# Patient Record
Sex: Female | Born: 1970 | Race: White | Hispanic: No | Marital: Married | Smoking: Never smoker
Health system: Southern US, Community
[De-identification: ages and names within clinical notes are randomized; demographics above are authoritative.]

## PROBLEM LIST (undated history)

## (undated) DIAGNOSIS — K219 Gastro-esophageal reflux disease without esophagitis: Secondary | ICD-10-CM

## (undated) DIAGNOSIS — N920 Excessive and frequent menstruation with regular cycle: Secondary | ICD-10-CM

## (undated) HISTORY — DX: Excessive and frequent menstruation with regular cycle: N92.0

## (undated) HISTORY — PX: WISDOM TOOTH EXTRACTION: SHX21

## (undated) HISTORY — DX: Gastro-esophageal reflux disease without esophagitis: K21.9

---

## 1998-07-11 ENCOUNTER — Other Ambulatory Visit: Admission: RE | Admit: 1998-07-11 | Discharge: 1998-07-11 | Payer: Self-pay | Admitting: Gynecology

## 1998-08-28 ENCOUNTER — Encounter: Admission: RE | Admit: 1998-08-28 | Discharge: 1998-11-26 | Payer: Self-pay | Admitting: Gynecology

## 1999-04-09 ENCOUNTER — Inpatient Hospital Stay (HOSPITAL_COMMUNITY): Admission: AD | Admit: 1999-04-09 | Discharge: 1999-04-12 | Payer: Self-pay | Admitting: Gynecology

## 1999-04-09 ENCOUNTER — Encounter (INDEPENDENT_AMBULATORY_CARE_PROVIDER_SITE_OTHER): Payer: Self-pay

## 1999-05-20 ENCOUNTER — Other Ambulatory Visit: Admission: RE | Admit: 1999-05-20 | Discharge: 1999-05-20 | Payer: Self-pay | Admitting: Gynecology

## 2000-06-30 ENCOUNTER — Other Ambulatory Visit: Admission: RE | Admit: 2000-06-30 | Discharge: 2000-06-30 | Payer: Self-pay | Admitting: Gynecology

## 2000-10-21 ENCOUNTER — Ambulatory Visit (HOSPITAL_COMMUNITY): Admission: RE | Admit: 2000-10-21 | Discharge: 2000-10-21 | Payer: Self-pay | Admitting: Gynecology

## 2001-06-30 ENCOUNTER — Other Ambulatory Visit: Admission: RE | Admit: 2001-06-30 | Discharge: 2001-06-30 | Payer: Self-pay | Admitting: Gynecology

## 2002-08-07 ENCOUNTER — Other Ambulatory Visit: Admission: RE | Admit: 2002-08-07 | Discharge: 2002-08-07 | Payer: Self-pay | Admitting: Gynecology

## 2003-09-05 ENCOUNTER — Other Ambulatory Visit: Admission: RE | Admit: 2003-09-05 | Discharge: 2003-09-05 | Payer: Self-pay | Admitting: Gynecology

## 2004-09-23 ENCOUNTER — Other Ambulatory Visit: Admission: RE | Admit: 2004-09-23 | Discharge: 2004-09-23 | Payer: Self-pay | Admitting: Gynecology

## 2005-11-25 ENCOUNTER — Ambulatory Visit: Payer: Self-pay | Admitting: Family Medicine

## 2005-12-17 ENCOUNTER — Ambulatory Visit: Payer: Self-pay | Admitting: Obstetrics & Gynecology

## 2005-12-17 ENCOUNTER — Encounter: Payer: Self-pay | Admitting: Obstetrics & Gynecology

## 2005-12-29 ENCOUNTER — Ambulatory Visit (HOSPITAL_COMMUNITY): Admission: RE | Admit: 2005-12-29 | Discharge: 2005-12-29 | Payer: Self-pay | Admitting: Obstetrics and Gynecology

## 2006-01-14 ENCOUNTER — Ambulatory Visit: Payer: Self-pay | Admitting: Obstetrics & Gynecology

## 2006-01-19 ENCOUNTER — Ambulatory Visit (HOSPITAL_COMMUNITY): Admission: RE | Admit: 2006-01-19 | Discharge: 2006-01-19 | Payer: Self-pay | Admitting: Obstetrics and Gynecology

## 2006-02-03 ENCOUNTER — Ambulatory Visit (HOSPITAL_COMMUNITY): Admission: RE | Admit: 2006-02-03 | Discharge: 2006-02-03 | Payer: Self-pay | Admitting: Obstetrics & Gynecology

## 2006-02-11 ENCOUNTER — Ambulatory Visit: Payer: Self-pay | Admitting: Obstetrics & Gynecology

## 2006-03-11 ENCOUNTER — Ambulatory Visit: Payer: Self-pay | Admitting: Obstetrics & Gynecology

## 2006-04-07 ENCOUNTER — Ambulatory Visit: Payer: Self-pay | Admitting: Obstetrics & Gynecology

## 2006-04-28 ENCOUNTER — Ambulatory Visit: Payer: Self-pay | Admitting: Obstetrics & Gynecology

## 2006-05-03 ENCOUNTER — Ambulatory Visit (HOSPITAL_COMMUNITY): Admission: RE | Admit: 2006-05-03 | Discharge: 2006-05-03 | Payer: Self-pay | Admitting: Gynecology

## 2006-05-11 ENCOUNTER — Ambulatory Visit: Payer: Self-pay | Admitting: Family Medicine

## 2006-05-19 ENCOUNTER — Ambulatory Visit: Payer: Self-pay | Admitting: Obstetrics & Gynecology

## 2006-05-26 ENCOUNTER — Ambulatory Visit: Payer: Self-pay | Admitting: Obstetrics & Gynecology

## 2006-06-02 ENCOUNTER — Ambulatory Visit: Payer: Self-pay | Admitting: Obstetrics & Gynecology

## 2006-06-10 ENCOUNTER — Ambulatory Visit: Payer: Self-pay | Admitting: Obstetrics & Gynecology

## 2006-06-16 ENCOUNTER — Ambulatory Visit: Payer: Self-pay | Admitting: Obstetrics & Gynecology

## 2006-06-24 ENCOUNTER — Ambulatory Visit: Payer: Self-pay | Admitting: Obstetrics & Gynecology

## 2006-06-30 ENCOUNTER — Inpatient Hospital Stay (HOSPITAL_COMMUNITY): Admission: AD | Admit: 2006-06-30 | Discharge: 2006-06-30 | Payer: Self-pay | Admitting: Gynecology

## 2006-07-01 ENCOUNTER — Ambulatory Visit: Payer: Self-pay | Admitting: Gynecology

## 2006-07-01 ENCOUNTER — Inpatient Hospital Stay (HOSPITAL_COMMUNITY): Admission: RE | Admit: 2006-07-01 | Discharge: 2006-07-03 | Payer: Self-pay | Admitting: Gynecology

## 2006-07-15 ENCOUNTER — Ambulatory Visit: Payer: Self-pay | Admitting: Gynecology

## 2006-08-19 ENCOUNTER — Ambulatory Visit: Payer: Self-pay | Admitting: Obstetrics & Gynecology

## 2006-08-19 ENCOUNTER — Encounter (INDEPENDENT_AMBULATORY_CARE_PROVIDER_SITE_OTHER): Payer: Self-pay | Admitting: Gynecology

## 2007-11-02 ENCOUNTER — Ambulatory Visit: Payer: Self-pay | Admitting: Obstetrics and Gynecology

## 2007-11-02 ENCOUNTER — Encounter: Payer: Self-pay | Admitting: Obstetrics and Gynecology

## 2008-03-16 HISTORY — PX: ABLATION: SHX5711

## 2009-02-01 ENCOUNTER — Emergency Department: Payer: Self-pay | Admitting: Emergency Medicine

## 2009-03-16 HISTORY — PX: ENDOMETRIAL BIOPSY: SHX622

## 2009-09-17 ENCOUNTER — Ambulatory Visit: Payer: Self-pay | Admitting: Obstetrics & Gynecology

## 2009-10-04 ENCOUNTER — Ambulatory Visit (HOSPITAL_COMMUNITY): Admission: RE | Admit: 2009-10-04 | Discharge: 2009-10-04 | Payer: Self-pay | Admitting: Anesthesiology

## 2009-10-22 ENCOUNTER — Ambulatory Visit: Payer: Self-pay | Admitting: Family Medicine

## 2009-10-22 LAB — CONVERTED CEMR LAB
Alkaline Phosphatase: 48 units/L (ref 39–117)
BUN: 14 mg/dL (ref 6–23)
Cholesterol: 161 mg/dL (ref 0–200)
Creatinine, Ser: 0.75 mg/dL (ref 0.40–1.20)
Glucose, Bld: 88 mg/dL (ref 70–99)
HCT: 41.3 % (ref 36.0–46.0)
HDL: 60 mg/dL (ref >39)
LDL Cholesterol: 86 mg/dL (ref 0–99)
MCHC: 32.4 g/dL (ref 30.0–36.0)
MCV: 88.6 fL (ref 78.0–100.0)
RBC: 4.66 M/uL (ref 3.87–5.11)
Sodium: 141 meq/L (ref 135–145)
Total Bilirubin: 0.4 mg/dL (ref 0.3–1.2)
Total CHOL/HDL Ratio: 2.7
Total Protein: 7.3 g/dL (ref 6.0–8.3)
Triglycerides: 77 mg/dL (ref <150)
VLDL: 15 mg/dL (ref 0–40)

## 2009-11-06 ENCOUNTER — Ambulatory Visit: Payer: Self-pay | Admitting: Family Medicine

## 2009-12-09 ENCOUNTER — Ambulatory Visit (HOSPITAL_COMMUNITY): Admission: RE | Admit: 2009-12-09 | Discharge: 2009-12-09 | Payer: Self-pay | Admitting: Family Medicine

## 2009-12-09 ENCOUNTER — Ambulatory Visit: Payer: Self-pay | Admitting: Family Medicine

## 2009-12-25 ENCOUNTER — Ambulatory Visit: Payer: Self-pay | Admitting: Family Medicine

## 2010-05-29 LAB — CBC
HCT: 42.6 % (ref 36.0–46.0)
MCH: 30.4 pg (ref 26.0–34.0)
MCHC: 33.7 g/dL (ref 30.0–36.0)
MCV: 90.1 fL (ref 78.0–100.0)
Platelets: 192 10*3/uL (ref 150–400)
RDW: 13.5 % (ref 11.5–15.5)

## 2010-07-29 NOTE — Assessment & Plan Note (Signed)
[name], GIRTEN            ACCOUNT NO.:  [id number]   MEDICAL RECORD NO.:  [id number]          PATIENT TYPE:  POB   LOCATION:  [facility] at [facility]         FACILITY:  [facility]   PHYSICIAN:  Tinnie Gens, MD        DATE OF BIRTH:  1970/04/12   DATE OF SERVICE:  12/25/2009                                  CLINIC NOTE   CHIEF COMPLAINT:  Postop check.   HISTORY OF PRESENT ILLNESS:  The patient is a 40 year old gravida 2,  para 2, who comes in today for postop check.  She underwent a  Thermachoice endometrial ablation on December 09, 2009.  She had a bit  of clear discharge for a while but none since then.  She has had no  bleeding since then.  Additionally, the patient got bitten by a dog over  the weekend at someone's house, then neighbors' dog came and bit her on  behind.  It did break the skin, she has not seen a physician for this,  the dog was up-to-date on all other shots.   PHYSICAL EXAMINATION:  VITAL SIGNS:  As noted in the chart.  GENERAL:  She is a well developed, well nourished, in no acute stress.  GU:  Normal external female genitalia.  BUS normal.  Vagina is pink and  rugated.  Cervix is nulliparous without lesion.  Uterus is small and  anteverted.  No adnexal mass or tenderness.   IMPRESSION:  Status post endometrial ablation, doing well.   PLAN:  1. Follow up as needed.  2. I discussed dog bite and seeking medical care should this happen      again.  Area in question looks well healed and is probably find      that infection, risk could still be a problem.           ______________________________  Tinnie Gens, MD     TP/MEDQ  D:  12/25/2009  T:  12/26/2009  Job:  [id number]

## 2010-07-29 NOTE — Discharge Summary (Signed)
NAMEAMINATA, BUFFALO            ACCOUNT NO.:  [id number]   MEDICAL RECORD NO.:  [id number]          PATIENT TYPE:  INP   LOCATION:  9101                          FACILITY:  [facility]   PHYSICIAN:  Allie Bossier, MD        DATE OF BIRTH:  March 30, 1970   DATE OF ADMISSION:  07/01/2006  DATE OF DISCHARGE:  07/03/2006                               DISCHARGE SUMMARY   ADMISSION DIAGNOSIS:  Desires repeat C-section, term intrauterine  pregnancy.   DISCHARGE DIAGNOSIS:  Repeat low transverse cesarean section, viable  female weight 7 pounds 13 ounces.   HISTORY:  This is a 40 year old G2, P 1-0-0-1 who presented at 39 weeks  as scheduled for a repeat low transverse C-section. Her EDD was April  23, by LMP confirmed by her ultrasound. Her pregnancy was uncomplicated.  She had no allergies.  Meds were prenatal vitamins.  She had a prior C-  section for breech and also had a septate uterus.  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   PAST SURGICAL HISTORY:  Significant for C-section, also repair of  septate uterus.   FAMILY HISTORY:  Father with colon cancer.   SOCIAL HISTORY:  Negative tobacco, alcohol and drugs.   ADMISSION PHYSICAL EXAM:  VITAL SIGNS:  She was afebrile, BP 114/83.  Fetal heart tones were in the 130s.  GENERAL:  Admission physical exam she is in no distress.  NECK: Supple.  LUNGS: Clear bilaterally.  HEART: RRR.  ABDOMEN:  She was term size gravid, abdomen was nontender.  PELVIC:  Pelvic exam was deferred.  EXTREMITIES:  Calves nontender.   PRENATAL LABS:  Significant for blood type B negative, antibody screen  negative, GBS was positive. Platelets 203, rubella immune, hepatitis  negative, HIV nonreactive.   HOSPITAL COURSE:  The patient underwent RLTCS.  There were no  complications, 700 mL blood loss. Apgars were eight and nine, a female  fetus. Postoperatively she did well and on postoperative day #2 she was  desirous of early discharge. She was tolerating regular diet,  had  flatus, was ambulatory without orthostatic symptoms, had scant lochia,  breast-feeding without problems.  Vital signs were stable.  Her  hemoglobin had been 12.8 and postoperatively was 10.7.  Her BMP was  normal. Physical exam was normal.  Her bowel sounds were present.  Fundus was below umbilicus and nontender.  Incision was clean, dry,  intact with Steri-Strips and no staples.  She did have subcutaneous  sutures. Extremities nontender. She was discharged home in good  condition.   DISCHARGE MEDICATIONS:  1. Ibuprofen 600 p.o. q.6 hours.  2. Percocet one or two every 4-6 hours for pain.  3. She was continued on vitamins.  4. Begun on Micronor 28.   Her follow-up was to be at [facility] p.r.n. and also for her 6-week  examination.      Deirdre Christy Gentles, C.N.M.      Allie Bossier, MD  Electronically Signed    DP/MEDQ  D:  08/11/2006  T:  08/12/2006  Job:  [id number]

## 2010-07-29 NOTE — Assessment & Plan Note (Signed)
[name], SCHWEBACH            ACCOUNT NO.:  [id number]   MEDICAL RECORD NO.:  [id number]          PATIENT TYPE:  POB   LOCATION:  [facility]         FACILITY:  [facility]   PHYSICIAN:  Argentina Donovan, MD        DATE OF BIRTH:  Apr 24, 1970   DATE OF SERVICE:  11/02/2007                                  CLINIC NOTE   The patient is a 40 year old Caucasian female gravida 2, para 2-0-0-2  with a child 49 months old and her husband has had vasectomy.  She has  no significant family history.  No breast cancer and no ovarian cancer  in the family.  The patient does not smoke, does not drink heavily, and  does not take illicit drugs.  She has no known allergies, and she is on  no medications.   PHYSICAL EXAMINATION:  VITAL SIGNS:  The patient is 5 feet 5 inches  tall, weighs 128 pounds, blood pressure 121/81, and pulse is 78 per  minute.  HEENT:  Within normal limits.  NECK:  Supple and thyroid is symmetrical with no masses.  LUNGS:  Clear to auscultation and percussion.  HEART:  No murmur, normal sinus rhythm.  BACK:  Erect.  There is no CVA tenderness.  ABDOMEN:  Soft, flat, nontender, and no masses or organomegaly.  BREASTS:  Symmetrical with no nipple discharge and no dominant masses.  EXTREMITIES:  No edema.  No varicosities.  NEUROLOGIC:  DTRs within normal limits.  GENITALIA:  External genitalia is normal.  Introitus is marital.  BUS is  within normal limits.  Vagina is clean and well rugated.  Cervix is  clean with an ostium it almost looks nulliparous and well  epithelialized.  The uterus is anterior with normal size, shape, and  consistency.  The adnexa is normal and free cul-de-sac.   The impression is normal gynecological examination.           ______________________________  Argentina Donovan, MD     PR/MEDQ  D:  11/02/2007  T:  11/03/2007  Job:  [id number]

## 2010-07-29 NOTE — Assessment & Plan Note (Signed)
[name], HANCOX            ACCOUNT NO.:  [id number]   MEDICAL RECORD NO.:  [id number]          PATIENT TYPE:  POB   LOCATION:  [facility]         FACILITY:  [facility]   PHYSICIAN:  Tinnie Gens, MD        DATE OF BIRTH:  Oct 01, 1970   DATE OF SERVICE:  10/22/2009                                  CLINIC NOTE   CHIEF COMPLAINT:  Endometrial biopsy.   HISTORY OF PRESENT ILLNESS:  The patient is a 40 year old gravida 2,  para 2 with 2 prior cesarean sections who was here today for abnormal  uterine bleeding.  She complains [place] of her yearly visit back in July and  was sent for pelvic sonogram.  Sonogram is normal and is reviewed today  with the patient.  Additionally, the patient was scheduled to have a  CBC, CMP, TSH, fasting lipids.  The patient has not had blood work done.  She is going to get it done today.  Additionally, the patient states she  has not had a period for approximately 6 weeks since she has been  waiting for to come.  Her husband has had a vasectomy, so she is  unconcerned about pregnancy, but this is the first time she has gone 2  weeks without a cycle.  Usually, they are just exceedingly heavy.   On exam, vitals are as noted in the chart.  She is a well-developed,  well-nourished female in no acute stress.  GU, normal external female  genitalia.  BUS is normal.  Vagina is pink and rugated.  Cervix is  nulliparous without lesions.   PROCEDURE:  Cervix was cleaned with Betadine x2, grasped with a single-  tooth tenaculum anteriorly.  A Pipelle was passed to the cervix easily  and endometrial sampling was obtained without difficulty.  The patient  tolerated the procedure well.  There was some bleeding from the  tenaculum site which was stopped with direct pressure.   IMPRESSION:  Menorrhagia and irregular menses with normal ultrasound.   PLAN:  1. CBC, CMP, TSH, fasting lipid today.  2. Endometrial sampling today.  Follow up in 2 weeks for discussion  of      results as well as treatment options including endometrial      ablation, IUD, hormonal manipulation, and a hysterectomy depending      on the results.           ______________________________  Tinnie Gens, MD     TP/MEDQ  D:  10/22/2009  T:  10/23/2009  Job:  [id number]

## 2010-07-29 NOTE — Assessment & Plan Note (Signed)
NAMEREID, NAWROT            ACCOUNT NO.:  [id number]   MEDICAL RECORD NO.:  [id number]          PATIENT TYPE:  POB   LOCATION:  [facility]         FACILITY:  [facility]   PHYSICIAN:  Tinnie Gens, MD        DATE OF BIRTH:  03/04/1971   DATE OF SERVICE:  11/06/2009                                  CLINIC NOTE   CHIEF COMPLAINT:  __________.   HISTORY OF PRESENT ILLNESS:  The patient is a 40 year old, gravida 2,  para 2, with 2 previous cesarean sections who comes in today for  abnormal uterine bleeding.  She had previously been seen initially in  July with complaints of heavy cycles.  Since that time, she has  undergone pelvic sonography which was essentially normal with the thin  endometrial stripe.  She has undergone endometrial sampling which shows  broken down benign disordered proliferative pattern with breakdown and  benign endocervical-type mucosa, but no evidence of hyperplasia or  malignancy.  Very lengthy discussion was had with the patient about  treatment options including medical and oral contraceptives, IUD,  endometrial ablation, and hysterectomy.  The patient is leaning toward  IUD versus endometrial ablation, but she is still unclear about which  one she would like to do.  She apparently wants to go home and talk that  with her husband.  She is given assurance about each of these  procedures.  Additionally, we discussed outpatient surgery versus office  procedures, recovery time, and should one of these procedures fail to be  a definitive treatment, we can always move on to a different type of  therapy.  The patient will call back when she has made the decision  about which way she would like to go.           ______________________________  Tinnie Gens, MD     TP/MEDQ  D:  11/06/2009  T:  11/07/2009  Job:  [id number]

## 2010-07-29 NOTE — Assessment & Plan Note (Signed)
NAMECALIANA, [name]            ACCOUNT NO.:  [id number]   MEDICAL RECORD NO.:  [id number]          PATIENT TYPE:  POB   LOCATION:  [facility] at [facility]         FACILITY:  [facility]   PHYSICIAN:  Tinnie Gens, MD        DATE OF BIRTH:  Mar 31, 1970   DATE OF SERVICE:  09/17/2009                                  CLINIC NOTE   CHIEF COMPLAINT:  Yearly exam and abnormal uterine bleeding.   HISTORY OF PRESENT ILLNESS:  The patient is a 40 year old gravida 2,  para 2 who has had 2 prior cesarean sections who comes in today for  yearly exam.  Her main complaint today is heavy vaginal bleeding over  the last 1 year.  She reports that her cycles are monthly and lasts for  approximately 5 days, but the first 2 days are extremely heavy flow with  lots of cramping, nausea, dizziness, and headaches.  She has been taking  over-the-counter ibuprofen.  However, this is not really working to ease  her pain.   PAST MEDICAL HISTORY:  Negative.   PAST SURGICAL HISTORY:  She had C-section x2 as well as transcervical  metroplasty before 10 years.   MEDICATIONS:  Multivitamin daily.   ALLERGIES:  None known.   FAMILY HISTORY:  Colon cancer in her father, diagnosed at age 52.   SOCIAL HISTORY:  She works in Audiological scientist.  She is social alcohol user.  No alcohol or drug use.   OBSTETRICAL HISTORY:  She is G2, P2 with 2 vaginal deliveries.   GYNECOLOGIC HISTORY:  No history of abnormal Pap or STDs.   PHYSICAL EXAMINATION:  VITAL SIGNS:  On exam today, her vitals are as  noted in the chart.  GENERAL:  She is a well-developed, well-nourished female in no acute  distress.  ABDOMEN:  Soft, nontender, nondistended.  HEENT:  Normocephalic, atraumatic.  Sclerae anicteric.  NECK:  Supple.  Normal thyroid.  LUNGS:  Clear bilaterally.  CV:  Regular rate and rhythm without murmurs.  BREASTS:  Symmetric with everted nipples.  No masses.  No  supraclavicular or axillary adenopathy.  GU:  Normal external female  genitalia.  BUS is normal.  Vagina is pink  and rugated.  Cervix is nulliparous without lesion.  Uterus is small,  anteverted.  No adnexal mass or tenderness.   IMPRESSION:  1. Yearly exam with Pap smear.  2. Menorrhagia, unclear etiology.   PLAN:  1. Return for fasting lipid, TSH, CMP, and CBC.  Pelvic ultrasound.      Return in 2-4 weeks for endometrial sampling.  2. Pap smear today.  3. Begin mammograms yearly at age 19.  4. First screening colonoscopy should be some time in her mid to late      80s, at least 10 years prior to index case of 24.           ______________________________  Tinnie Gens, MD     TP/MEDQ  D:  09/17/2009  T:  09/18/2009  Job:  [id number]

## 2010-08-01 NOTE — Op Note (Signed)
NAMEJHOSELIN, [name]            ACCOUNT NO.:  [id number]   MEDICAL RECORD NO.:  [id number]          PATIENT TYPE:  POB   LOCATION:  [facility]                          FACILITY:  [facility]   PHYSICIAN:  Ginger Carne, MD  DATE OF BIRTH:  [birth year]   DATE OF PROCEDURE:  07/01/2006  DATE OF DISCHARGE:                               OPERATIVE REPORT   PREOPERATIVE DIAGNOSIS:  1. Term intrauterine pregnancy.  2. Desire for repeat low transverse cesarean section.   POSTOPERATIVE DIAGNOSIS:  1. Term intrauterine pregnancy.  2. Desire for repeat low transverse cesarean section.   PROCEDURE:  Repeat low transverse cesarean section via Pfannenstiel skin  incision.   SURGEON:  Ginger Carne, M.D.  Tracy L. Mayford Knife, M.D.   ANESTHESIA:  Spinal.   SPECIMENS:  Placenta to labor and delivery, cord blood to lab.   ESTIMATED BLOOD LOSS:  700 mL.   FINDINGS:  Viable female baby in the cephalic presentation, Apgars 8 and  9, weight 7 pounds 13 ounces, nuchal cord x1, meconium stained fluid.  The baby had a spontaneous cry at delivery.  Placenta with central cord  insertion.  The uterus had a heart shaped appearance consistent with her  history of having had removal of a septum.  Normal tubes and ovaries.   DESCRIPTION OF PROCEDURE:  The patient was taken to the operating room  where spinal anesthesia was placed and found to be adequate.  She was  prepped and draped in the usual sterile fashion in the dorsal supine  position with a leftward tilt.  A skin incision was made and carried  through the fascia to the abdominal cavity which was entered.  A bladder  flap was created.  A uterine incision was made.  The infant's head was  delivered atraumatically.  There was a nuchal cord x1 that was reduced.  The rest of the body was atraumatically delivered.  The placenta was  manually removed.  The uterus was exteriorized and cleared of all clot  and debris.  The uterus was  closed with 0 Vicryl  in a running locked fashion.  Excellent hemostasis  was obtained.  The fascia was closed with 0 Vicryl.  The skin was closed  with 4-0 Prolene.  The patient tolerated the procedure well.  Sponge,  lap, and needle counts were correct x2.  Ancef was given at cord clamp.     ______________________________  Marc Morgans. Mayford Knife, M.D.      Ginger Carne, MD  Electronically Signed    TLW/MEDQ  D:  07/01/2006  T:  07/01/2006  Job:  [id number]

## 2010-08-01 NOTE — Op Note (Signed)
[facility]  Patient:    [name]                    MRN: [id number] Proc. Date: 04/08/99 Adm. Date:  [id number] Attending:  Tonye Royalty                           Operative Report  PREOPERATIVE DIAGNOSIS:       Term intrauterine pregnancy at 41 weeks estimated  gestational age.  Uterine anomaly.  Breech presentation.  POSTOPERATIVE DIAGNOSIS:      Term intrauterine pregnancy at 82 weeks estimated  gestational age.  Uterine anomaly.  Breech presentation.  Septated uterus.  OPERATION:                    Primary low transverse cesarean section.  SURGEON:                      Juan H. Lily Peer, M.D.  ASSISTANTMarcial Pacas P. Fontaine, M.D.  ANESTHESIA:                   Spinal.  FINDINGS:                     A viable female infant with Apgars of 6 and 9 with a weight of 7 pounds 1 ounce.  Septated uterus was noted.  Fetus in incomplete breech presentation, normal otherwise.  The remainder of maternal pelvic anatomy was normal.  ESTIMATED BLOOD LOSS:  INDICATIONS:                  A 40 year old, gravida 1, para 0, at 45 weeks estimated gestational age with a known uterine anomaly, arcuate uterus/partial septated uterus.  Fetus also had persistent breech presentation.  DESCRIPTION OF PROCEDURE:     After the patient was adequately counseled, she was taken to the operating room where she underwent a successful spinal anesthesia.  She was placed in the supine position and a Foley catheter was inserted to monitor urinary output.  The abdomen was prepped and draped in the usual sterile fashion. A skin incision was made 2 cm above the symphysis pubis.  The incision was carried down from the skin, subcutaneous tissue, down to the rectus fascia whereby a midline nick was made.  The fascia was incised in a transverse fashion, the midline raphe was entered, and the peritoneal cavity was entered cautiously.   The bladder flap was established.  The lower uterine segment was incised in a transverse fashion.  Clear amniotic fluid was present.  The fetus was delivered in the breech presentation without any complications.  The cord was doubly clamped and incised. The newborn was passed off to the pediatrician who gave the above mentioned parameters.  After cord blood was obtained, the placenta was delivered from the  intrauterine cavity.  It was noted at this time that the intrauterine cavity had a partial septated uterus near the fundal and upper 1/3 and the external surface id demonstrate one single uterus with a slight indentation in the fundus, but no duplication or separate horns were noted.  Both tubes and ovaries were normal size, shape, and consistency.  After the uterus was exteriorized, the intrauterine cavity was cleared of remaining products of conception along with irrigation with normal  saline solution.  The uterus was closed in a running stitch fashion with 0 Vicryl suture, the first in interlocking stitch manner, the second in imbricating manner. The uterus was placed back into the abdominal cavity.  The pelvic cavity was copiously irrigated with normal saline solution.  After ascertaining adequate hemostasis, the rectus fascia was closed with a running stitch of 0 Vicryl suture. The visceroperitoneum was not closed.  The subcutaneous bleeders were Bovie cauterized.  The skin was reapproximated with skin clips followed by placement f Xeroform gauze and 4 x 8 dressing.  The patient was transferred to the recovery  room with stable vital signs.  Estimated blood loss was 800 cc.  IV fluids was 300 cc of Ringers lactate and she did receive a gram of Cefotan after the cord was clamped.  Urine output was 150 cc and clear. DD:  04/09/99 TD:  04/09/99 Job: [id number]

## 2010-08-01 NOTE — Discharge Summary (Signed)
[facility]  Patient:    [name]                    MRN: [id number] Adm. Date:  [id number] Disc. Date: [id number] Attending:  Tonye Royalty Dictator:   Gwendolyn Fill. Gatewood, P.A.                           Discharge Summary  DISCHARGE DIAGNOSES:          1. Intrauterine pregnancy at 39 weeks, delivered.                               2. Uterine anomaly.                               3. Breech presentation.                               4. Septate uterus.                               5. Status post primary low transverse cesarean                                  section by Dr. Reynaldo Minium on April 09, 1999.                               5. Rh negative.  HISTORY:                      This is a 40 year old female gravida 1 para 0 with an EDC of April 16, 1999.  Prenatal course was complicated by patient having a uterine anomaly such as arcuate uterus versus small septation.  Patient is also Rh negative, received RhoGAM in pregnancy.   HOSPITAL COURSE:              On April 09, 1999 patient presented and underwent a primary lower transverse cesarean section by Dr. Reynaldo Minium and was found to have breech presentation with septate uterus.  Underwent delivery on April 09, 1999 at 0921 of a female, Apgars of 6 and 9, weight of 7 pounds and 1 ounce. There were no complications.  Postpartum, patient remained afebrile, voiding, in stable condition.  She was discharged to home on April 12, 1999.  INSTRUCTIONS:                 Given [facility] Gynecology postpartum instructions and postpartum booklet.  ACCESSORY CLINICAL FINDINGS:  LABORATORY:                   Patient is B negative, rubella immune.  On April 12, 1999, hemoglobin was 9.6, hematocrit 27.5.  DISPOSITION:                  Patient discharged to home.  FOLLOW-UP:                    Informed to return to office in six weeks, and any problem prior to  that time, to be seen in the  office.  DISCHARGE MEDICATIONS:        Given prescription for Tylox #20 p.r.n. pain and patient received RhoGAM prior to discharge. DD:  04/29/99 TD:  04/29/99 Job: [id number]

## 2010-08-01 NOTE — H&P (Signed)
[facility]  Patient:    [name], [name]                     MRN: [id number] Adm. Date:  10/21/00 Attending:  Gaetano Hawthorne. Lily Peer, M.D.                         History and Physical  CHIEF COMPLAINT:              Uterine septum.  HISTORY:                      The patient is a 40 year old gravida 1, para 1. On April 08, 2000, patient underwent a primary cesarean section secondary to breech presentation and it was at that time the septated uterus was noted. Patient was subsequently seen for her postpartum visit in the office and underwent a sonohysterogram which demonstrated that the intrauterine septum extended down from the fundus to approximately 2.5 cm from the internal cervical os, where it begins to bifurcate, approximately 2 cm above the internal os.  No intracavitary defects were noted.  Both ovaries were normal and uterus was normal size, with the exception of the septation.  Patient is contemplating getting pregnant in the future.  It was decided that it would be better at this time to proceed with transcervical metroplasty to remove this uterine septum, since she knows that she is at increased risk for pregnancy losses as well as fetal malpresentation.  She did receive Lupron 11.25 mg approximately two months ago in an effort to thin the endometrium and for better visualization of the intrauterine cavity for the transection of the septum.  She was seen in the office today for preoperative counseling and underwent placement of a laminaria that was placed transcervically to facilitate the insertion of the operative resectoscope tomorrow in the operating room.  MEDICATIONS:                  She is currently on Ortho Tri-Cyclen 28-day oral contraceptive pills.  She is not taking any other medications.  PAST MEDICAL HISTORY:         She has had a previous cesarean section in the year 2001.  FAMILY HISTORY:               Father with history of  hypertension and daughter with bilateral auditory defects.  PHYSICAL EXAMINATION:  GENERAL:                      Well-developed, well-nourished female.  HEENT:                        Unremarkable.  NECK:                         Supple.  Trachea midline.  No carotid bruits. No thyromegaly.  LUNGS:                        Clear to auscultation.  Without rhonchi or wheezes.  HEART:                        Regular rate and rhythm.  No murmurs or gallops.  BREASTS:                      Examination was  done at the time of her postpartum visit on April 17th and was reported to be normal.  ABDOMEN:                      Soft, nontender, without rebound or guarding.  PELVIC:                       Bartholins, urethra and Skenes glands within normal limits.  Vagina and cervix:  No lesion or discharge.  Uterus anteverted, slightly irregular in the fundal region.  Adnexa:  No mass or tenderness.  RECTAL:                       Exam not performed.  ASSESSMENT:                   [age]-year-old gravida 1, para 1 with uterine anomaly consisting of septated uterus.  Patient was counseled for transcervical metroplasty and with concurrent laparoscopy to prevent inadvertent perforation of the uterus as we are transecting the uterine septum.  Other risks from the operation would include fluid overload and potential risk for pulmonary edema requiring diuretics; also, the risk of infection, although she will receive antibiotics to prevent infection from occurring, also the risks of deep venous thrombosis and pulmonary embolism discussed and also in the event of uterine perforation and bleeding unable to be contained laparoscopically, that we may need to open her abdomen up and go directly to secure and stop any bleeding that could develop.  All these risks were discussed with the patient and accepts and we will remove the laminaria that was placed in the office today.  All questions were answered  and will follow accordingly.  PLAN:                         Patient is scheduled for a transcervical metroplasty with concurrent laparoscopy tomorrow, August 8th, at [facility]. DD:  10/20/00 TD:  10/20/00 Job: [id number]

## 2010-08-01 NOTE — Op Note (Signed)
[facility]  Patient:    [name], [name]                     MRN: [id number] Proc. Date: 10/21/00 Attending:  Gaetano Hawthorne. Lily Peer, M.D.                           Operative Report  PREOPERATIVE DIAGNOSIS:       Uterine septum.  POSTOPERATIVE DIAGNOSIS:      Uterine septum.  PROCEDURES PERFORMED:         1. Transcervical metroplasty.                               2. Laparoscopy.  SURGEON:                      Juan H. Lily Peer, M.D.  ANESTHESIA:                   General endotracheal anesthesia.  INDICATIONS:                  A 40 year old gravida 1, para 1 status post cesarean section in January of this year secondary to breech presentation was noted to have an intrauterine septum.  Preoperatively, she underwent sonohystogram and confirmed an intrauterine septum.  FINDINGS:                     Normal tubes and ovaries and single uterus on laparoscopic evaluation.  Hysteroscopically, a uterine septum was noted from the fundal region to approximately 1.5 cm from the internal cervical os.  Both tubal ostia were identified.  DESCRIPTION OF PROCEDURE:     After the patient was adequately counseled, she was taken to the operating room, where she underwent successful general endotracheal anesthesia.  She was placed in the low lithotomy position.  The abdomen, vagina and perineum were prepped and draped in the usual sterile fashion.  A Foley catheter was placed in an effort to monitor urinary output. After the drapes were in place, a small stab incision was placed in the infraumbilical region, where a 10 mm trocar was inserted, followed by insertion of a diagnostic laparoscope.  Of note, the patient did receive 1 g of Cefotan prophylactically preoperatively.  After the laparoscope was in place, systemic inspection of the pelvic cavity demonstrated normal tubes and ovaries, a single uterus with a normal smooth surface and no abnormalities in the  anterior or posterior cul-de-sac.  The laparoscope was left in place and attention was placed to the transcervical metroplasty portion of the operation.  A small weighted speculum was placed on the posterior vaginal wall and a Sims retractor in the anterior portion for exposure.  A previously-placed laminaria had been removed requiring no dilatation of the cervix, allowing the operative resectoscope with the 0 degree straight wire loop to be introduced transcervically.  Sorbitol 3% was then distending medium.  Pictures before the transcervical metroplasty were obtained, whereby the septum was noted.  The scope was introduced into each separate cavity and tubal ostia were identified on each cavity.  The [facility] generator was utilized with an 80 watt cutting mode.  Staining the mid portion of the septum, the avascular region was transected slowly, simultaneous looking through the second video camera at the laparoscopic portion looking at the external surface of the uterus to determine  the end point and prevent any uterine perforation.  This was accomplished without any complication up to the junxta portion of the uterus, which was vascular, which was our end point.  The lights in the operating room were turned off, and transillumination demonstrated that there had been no perforation and we were at our end point.  At this point, both tubal ostia were identified and one single cavity was evident.  No other abnormalities were noted.  Pictures were taken after the transcervical metroplasty.  The hysteroscope was removed. Attention was then placed to the abdominal region, where the laparoscope had previously been inserted.  The laparoscope was then removed.  Carbon dioxide was removed.  The subumbilical fascia was reapproximated with a running stitch of 3-0 Vicryl suture.  The skin was reapproximated with 4-0 Monocryl subcutaneously.  For postoperative analgesia, 0.25%  Marcaine was infiltrated subcutaneously, a total of 6 cc.  The incision was covered with a Band-aid. The patient was extubated and transferred to the recovery room with stable vital signs.  Blood loss for the procedure was minimal.  Fluid resuscitation consisted of 1000 cc of lactated Ringers.  Urine output was 250 cc.  There was a 250 cc fluid deficit from the 3% Sorbitol distending medium. DD:  10/21/00 TD:  10/21/00 Job: [id number]

## 2011-05-12 IMAGING — CR LEFT WRIST - COMPLETE 3+ VIEW
1 series · 4 of 4 positions shown · non-contrast
Comparison: none

REASON FOR EXAM: injury/ fall    RME 2
COMMENTS:   LMP: Now

PROCEDURE:     DXR - DXR WRIST LT COMP WITH OBLIQUES  - February 01, 2009  [DATE]
RESULT:     Four views of the left wrist reveal the bones to be adequately
mineralized. I do not see evidence of an acute fracture. The ulnar styloid
is intact. The overlying soft tissues appear normal.

[Series 1: view not recorded · 0.17mm/px · 4 of 4 slices shown]
[im 1/4]
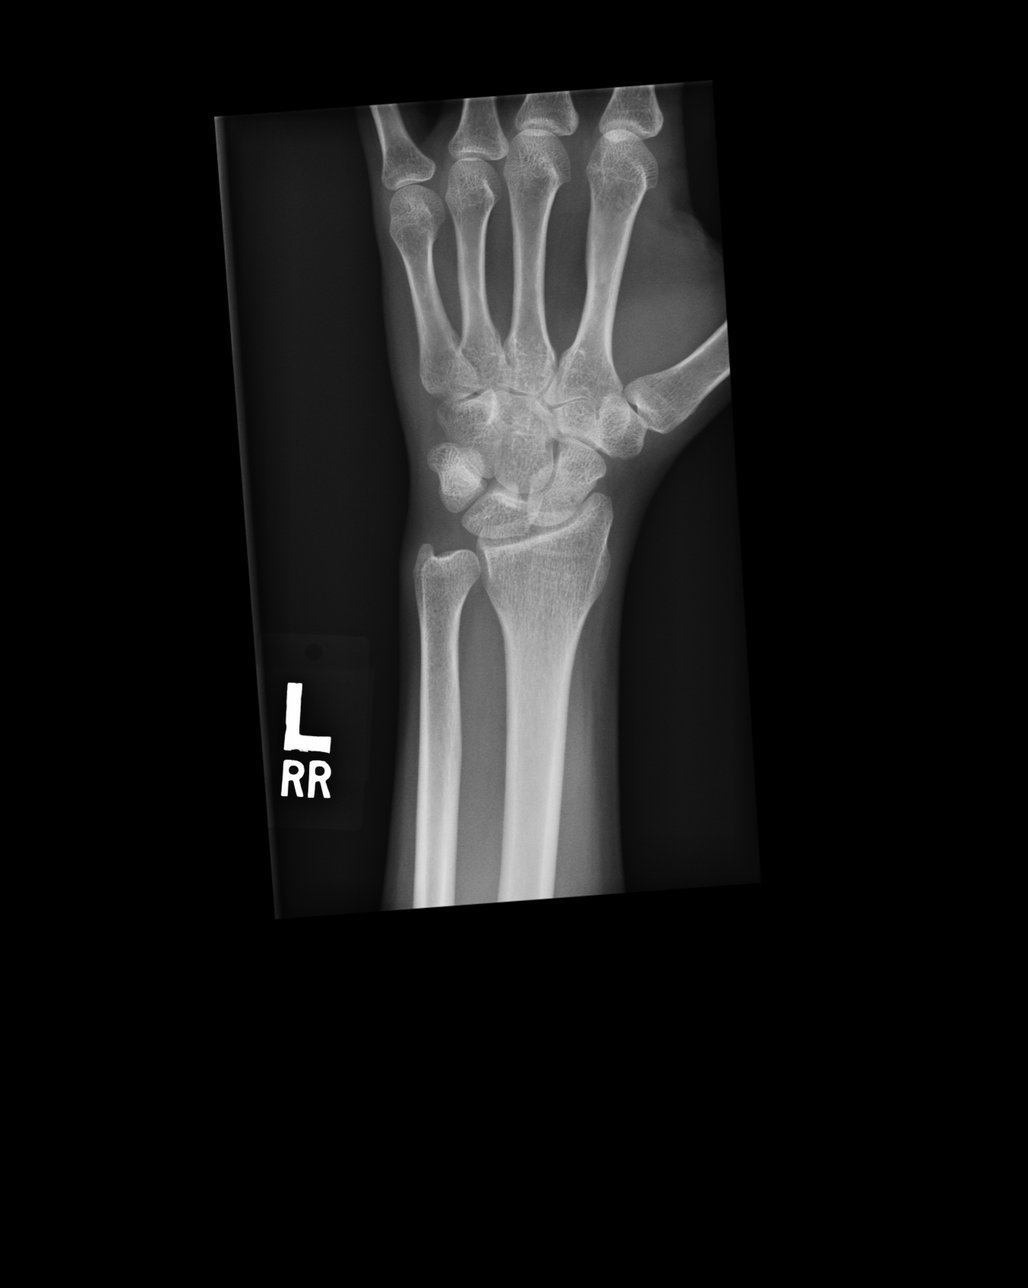
[im 2/4]
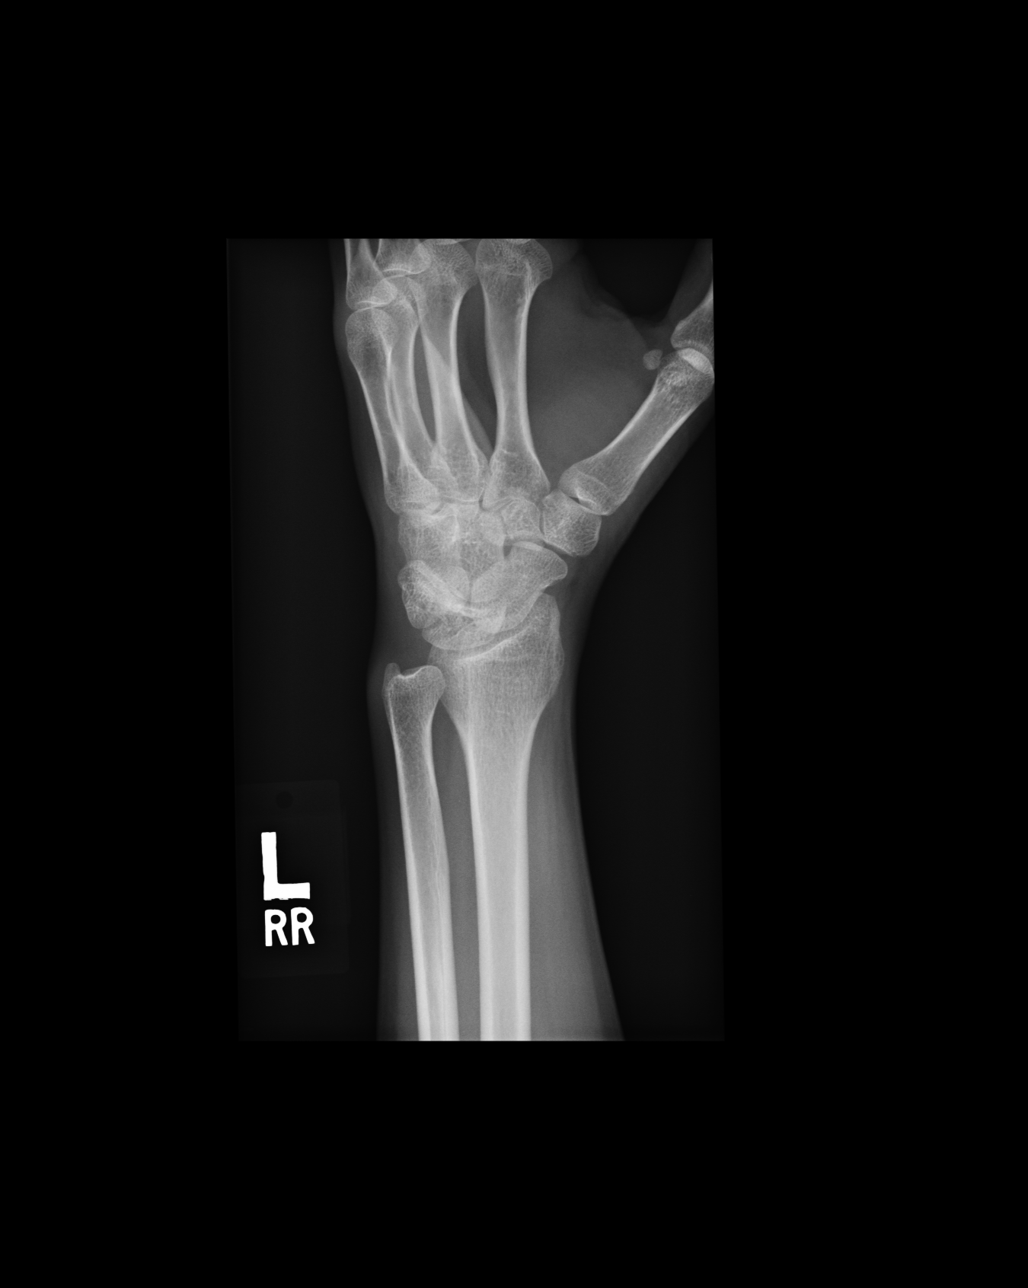
[im 3/4]
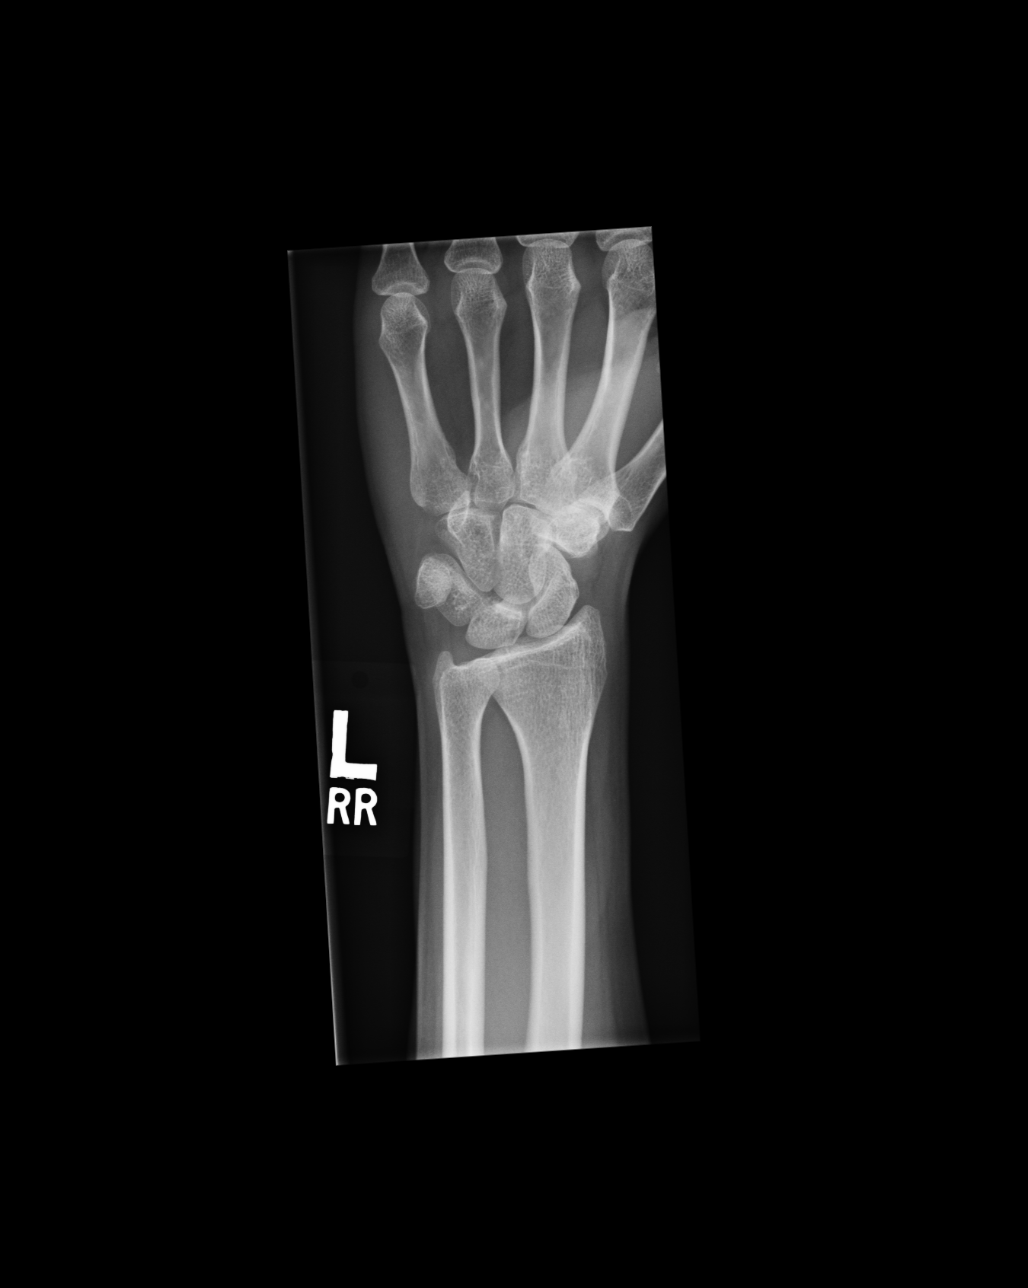
[im 4/4]
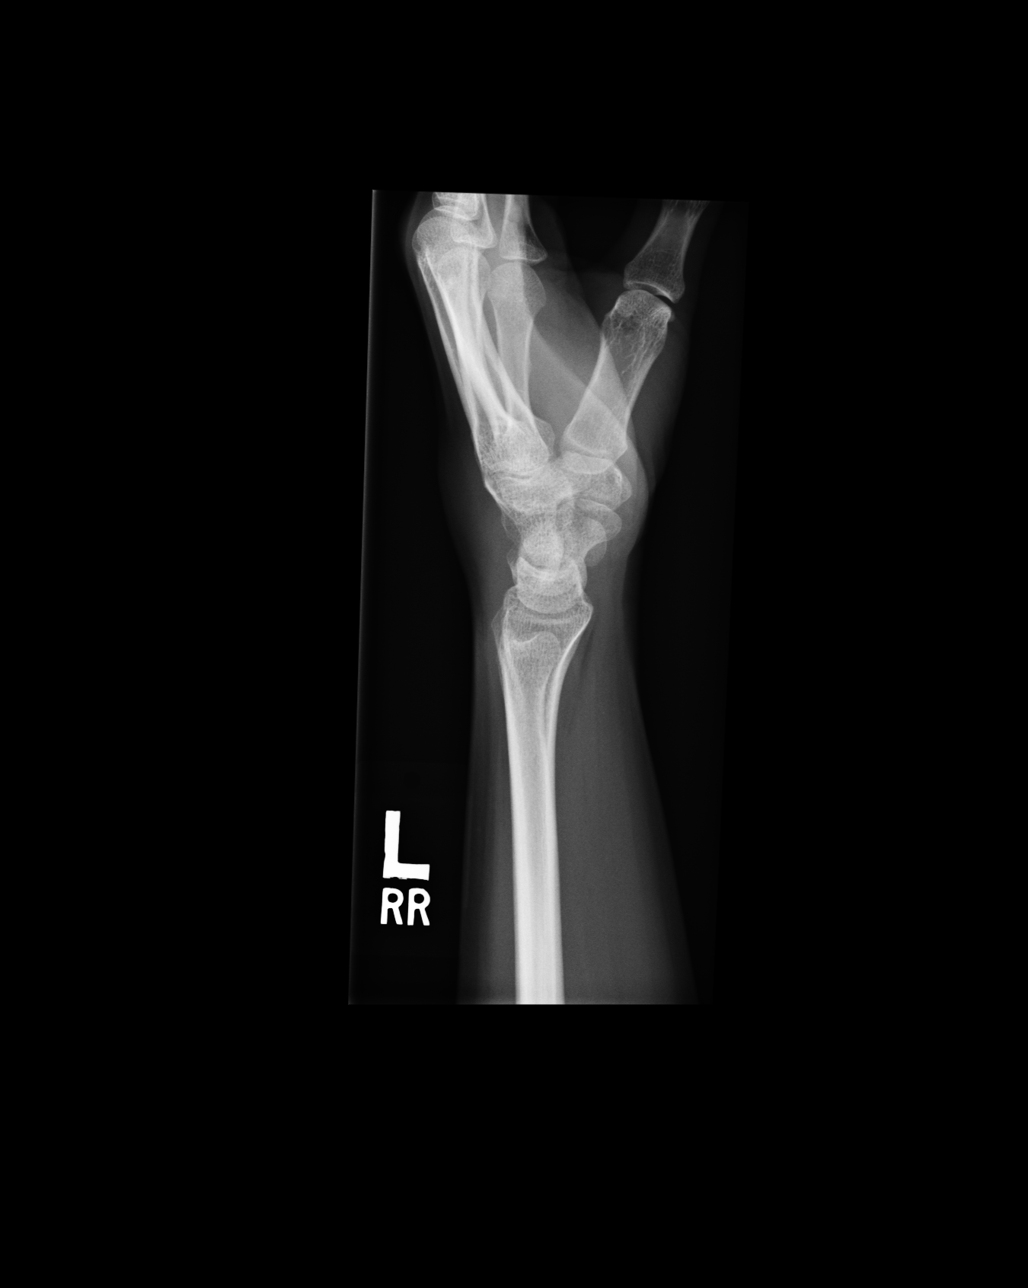

[4 of 4 positions shown; findings below may reference images not displayed]

IMPRESSION: I do not see acute bony abnormality of the left wrist.

## 2011-11-03 ENCOUNTER — Encounter: Payer: Self-pay | Admitting: Family Medicine

## 2011-11-03 ENCOUNTER — Ambulatory Visit (INDEPENDENT_AMBULATORY_CARE_PROVIDER_SITE_OTHER): Payer: PRIVATE HEALTH INSURANCE | Admitting: Family Medicine

## 2011-11-03 VITALS — BP 124/81 | HR 75 | Ht 65.0 in | Wt 123.0 lb

## 2011-11-03 DIAGNOSIS — Z01419 Encounter for gynecological examination (general) (routine) without abnormal findings: Secondary | ICD-10-CM

## 2011-11-03 DIAGNOSIS — Z124 Encounter for screening for malignant neoplasm of cervix: Secondary | ICD-10-CM

## 2011-11-03 DIAGNOSIS — Z1151 Encounter for screening for human papillomavirus (HPV): Secondary | ICD-10-CM

## 2011-11-03 NOTE — Progress Notes (Signed)
  Subjective:     [name] is a 41 y.o. female and is here for a comprehensive physical exam. The patient reports problems - Had several hot flashes at night where she was forced to remove the covers that no night sweats or hot flashes during the day. The patient continues to have some regular cycles but her flow is very light after endometrial ablation. Her husband has had a vasectomy.  Past Medical History  Diagnosis Date  . Menorrhagia    Past Surgical History  Procedure Date  . Endometrial biopsy 2011    MENORRHAGIA  . Cesarean section     X2   No Known Allergies No current outpatient prescriptions on file prior to visit.    History   Social History  . Marital Status: Married    Spouse Name: N/A    Number of Children: N/A  . Years of Education: N/A   Occupational History  . Not on file.   Social History Main Topics  . Smoking status: Never Smoker   . Smokeless tobacco: Not on file  . Alcohol Use: Yes     social  . Drug Use: No  . Sexually Active: Yes -- Female partner(s)    Birth Control/ Protection: None     spouse had vasectomy.   Other Topics Concern  . Not on file   Social History Narrative  . No narrative on file   Health Maintenance  Topic Date Due  . Pap Smear  09/01/1989  . Tetanus/tdap  09/02/1990  . Influenza Vaccine  12/15/2011    The following portions of the patient's history were reviewed and updated as appropriate: allergies, current medications, past family history, past medical history, past social history, past surgical history and problem list.  Review of Systems A comprehensive review of systems was negative.   Objective:    BP 124/81  Pulse 75  Ht 5\' 5"  (1.651 m)  Wt 123 lb (55.792 kg)  BMI 20.47 kg/m2 General appearance: alert, cooperative and appears stated age Head: Normocephalic, without obvious abnormality, atraumatic Neck: no adenopathy, supple, symmetrical, trachea midline and thyroid not enlarged, symmetric, no  tenderness/mass/nodules Lungs: clear to auscultation bilaterally Breasts: normal appearance, no masses or tenderness Heart: regular rate and rhythm, S1, S2 normal, no murmur, click, rub or gallop Abdomen: soft, non-tender; bowel sounds normal; no masses,  no organomegaly Pelvic: cervix normal in appearance, external genitalia normal, no adnexal masses or tenderness, no cervical motion tenderness, uterus normal size, shape, and consistency and vagina normal without discharge Extremities: extremities normal, atraumatic, no cyanosis or edema Pulses: 2+ and symmetric Skin: Skin color, texture, turgor normal. No rashes or lesions Lymph nodes: Cervical, supraclavicular, and axillary nodes normal. Neurologic: Grossly normal    Assessment:    Healthy female exam. Doing well.     Plan:    Should need blood work next year. 1. Routine gynecological examination  Cytology - PAP, MM Digital Screening    See After Visit Summary for Counseling Recommendations

## 2011-11-03 NOTE — Patient Instructions (Signed)
Preventive Care for Adults, Female A healthy lifestyle and preventive care can promote health and wellness. Preventive health guidelines for women include the following key practices.  A routine yearly physical is a good way to check with your caregiver about your health and preventive screening. It is a chance to share any concerns and updates on your health, and to receive a thorough exam.   Visit your dentist for a routine exam and preventive care every 6 months. Brush your teeth twice a day and floss once a day. Good oral hygiene prevents tooth decay and gum disease.   The frequency of eye exams is based on your age, health, family medical history, use of contact lenses, and other factors. Follow your caregiver's recommendations for frequency of eye exams.   Eat a healthy diet. Foods like vegetables, fruits, whole grains, low-fat dairy products, and lean protein foods contain the nutrients you need without too many calories. Decrease your intake of foods high in solid fats, added sugars, and salt. Eat the right amount of calories for you.Get information about a proper diet from your caregiver, if necessary.   Regular physical exercise is one of the most important things you can do for your health. Most adults should get at least 150 minutes of moderate-intensity exercise (any activity that increases your heart rate and causes you to sweat) each week. In addition, most adults need muscle-strengthening exercises on 2 or more days a week.   Maintain a healthy weight. The body mass index (BMI) is a screening tool to identify possible weight problems. It provides an estimate of body fat based on height and weight. Your caregiver can help determine your BMI, and can help you achieve or maintain a healthy weight.For adults 20 years and older:   A BMI below 18.5 is considered underweight.   A BMI of 18.5 to 24.9 is normal.   A BMI of 25 to 29.9 is considered overweight.   A BMI of 30 and above is  considered obese.   Maintain normal blood lipids and cholesterol levels by exercising and minimizing your intake of saturated fat. Eat a balanced diet with plenty of fruit and vegetables. Blood tests for lipids and cholesterol should begin at age 20 and be repeated every 5 years. If your lipid or cholesterol levels are high, you are over 50, or you are at high risk for heart disease, you may need your cholesterol levels checked more frequently.Ongoing high lipid and cholesterol levels should be treated with medicines if diet and exercise are not effective.   If you smoke, find out from your caregiver how to quit. If you do not use tobacco, do not start.   If you are pregnant, do not drink alcohol. If you are breastfeeding, be very cautious about drinking alcohol. If you are not pregnant and choose to drink alcohol, do not exceed 1 drink per day. One drink is considered to be 12 ounces (355 mL) of beer, 5 ounces (148 mL) of wine, or 1.5 ounces (44 mL) of liquor.   Avoid use of street drugs. Do not share needles with anyone. Ask for help if you need support or instructions about stopping the use of drugs.   High blood pressure causes heart disease and increases the risk of stroke. Your blood pressure should be checked at least every 1 to 2 years. Ongoing high blood pressure should be treated with medicines if weight loss and exercise are not effective.   If you are 55 to 40   years old, ask your caregiver if you should take aspirin to prevent strokes.   Diabetes screening involves taking a blood sample to check your fasting blood sugar level. This should be done once every 3 years, after age 45, if you are within normal weight and without risk factors for diabetes. Testing should be considered at a younger age or be carried out more frequently if you are overweight and have at least 1 risk factor for diabetes.   Breast cancer screening is essential preventive care for women. You should practice "breast  self-awareness." This means understanding the normal appearance and feel of your breasts and may include breast self-examination. Any changes detected, no matter how small, should be reported to a caregiver. Women in their 20s and 30s should have a clinical breast exam (CBE) by a caregiver as part of a regular health exam every 1 to 3 years. After age 40, women should have a CBE every year. Starting at age 40, women should consider having a mammography (breast X-ray test) every year. Women who have a family history of breast cancer should talk to their caregiver about genetic screening. Women at a high risk of breast cancer should talk to their caregivers about having magnetic resonance imaging (MRI) and a mammography every year.   The Pap test is a screening test for cervical cancer. A Pap test can show cell changes on the cervix that might become cervical cancer if left untreated. A Pap test is a procedure in which cells are obtained and examined from the lower end of the uterus (cervix).   Women should have a Pap test starting at age 21.   Between ages 21 and 29, Pap tests should be repeated every 2 years.   Beginning at age 30, you should have a Pap test every 3 years as long as the past 3 Pap tests have been normal.   Some women have medical problems that increase the chance of getting cervical cancer. Talk to your caregiver about these problems. It is especially important to talk to your caregiver if a new problem develops soon after your last Pap test. In these cases, your caregiver may recommend more frequent screening and Pap tests.   The above recommendations are the same for women who have or have not gotten the vaccine for human papillomavirus (HPV).   If you had a hysterectomy for a problem that was not cancer or a condition that could lead to cancer, then you no longer need Pap tests. Even if you no longer need a Pap test, a regular exam is a good idea to make sure no other problems are  starting.   If you are between ages 65 and 70, and you have had normal Pap tests going back 10 years, you no longer need Pap tests. Even if you no longer need a Pap test, a regular exam is a good idea to make sure no other problems are starting.   If you have had past treatment for cervical cancer or a condition that could lead to cancer, you need Pap tests and screening for cancer for at least 20 years after your treatment.   If Pap tests have been discontinued, risk factors (such as a new sexual partner) need to be reassessed to determine if screening should be resumed.   The HPV test is an additional test that may be used for cervical cancer screening. The HPV test looks for the virus that can cause the cell changes on the cervix.   The cells collected during the Pap test can be tested for HPV. The HPV test could be used to screen women aged 30 years and older, and should be used in women of any age who have unclear Pap test results. After the age of 30, women should have HPV testing at the same frequency as a Pap test.   Colorectal cancer can be detected and often prevented. Most routine colorectal cancer screening begins at the age of 50 and continues through age 75. However, your caregiver may recommend screening at an earlier age if you have risk factors for colon cancer. On a yearly basis, your caregiver may provide home test kits to check for hidden blood in the stool. Use of a small camera at the end of a tube, to directly examine the colon (sigmoidoscopy or colonoscopy), can detect the earliest forms of colorectal cancer. Talk to your caregiver about this at age 50, when routine screening begins. Direct examination of the colon should be repeated every 5 to 10 years through age 75, unless early forms of pre-cancerous polyps or small growths are found.   Hepatitis C blood testing is recommended for all people born from 1945 through 1965 and any individual with known risks for hepatitis C.    Practice safe sex. Use condoms and avoid high-risk sexual practices to reduce the spread of sexually transmitted infections (STIs). STIs include gonorrhea, chlamydia, syphilis, trichomonas, herpes, HPV, and human immunodeficiency virus (HIV). Herpes, HIV, and HPV are viral illnesses that have no cure. They can result in disability, cancer, and death. Sexually active women aged 25 and younger should be checked for chlamydia. Older women with new or multiple partners should also be tested for chlamydia. Testing for other STIs is recommended if you are sexually active and at increased risk.   Osteoporosis is a disease in which the bones lose minerals and strength with aging. This can result in serious bone fractures. The risk of osteoporosis can be identified using a bone density scan. Women ages 65 and over and women at risk for fractures or osteoporosis should discuss screening with their caregivers. Ask your caregiver whether you should take a calcium supplement or vitamin D to reduce the rate of osteoporosis.   Menopause can be associated with physical symptoms and risks. Hormone replacement therapy is available to decrease symptoms and risks. You should talk to your caregiver about whether hormone replacement therapy is right for you.   Use sunscreen with sun protection factor (SPF) of 30 or more. Apply sunscreen liberally and repeatedly throughout the day. You should seek shade when your shadow is shorter than you. Protect yourself by wearing long sleeves, pants, a wide-brimmed hat, and sunglasses year round, whenever you are outdoors.   Once a month, do a whole body skin exam, using a mirror to look at the skin on your back. Notify your caregiver of new moles, moles that have irregular borders, moles that are larger than a pencil eraser, or moles that have changed in shape or color.   Stay current with required immunizations.   Influenza. You need a dose every fall (or winter). The composition of  the flu vaccine changes each year, so being vaccinated once is not enough.   Pneumococcal polysaccharide. You need 1 to 2 doses if you smoke cigarettes or if you have certain chronic medical conditions. You need 1 dose at age 65 (or older) if you have never been vaccinated.   Tetanus, diphtheria, pertussis (Tdap, Td). Get 1 dose of   Tdap vaccine if you are younger than age 65, are over 65 and have contact with an infant, are a healthcare worker, are pregnant, or simply want to be protected from whooping cough. After that, you need a Td booster dose every 10 years. Consult your caregiver if you have not had at least 3 tetanus and diphtheria-containing shots sometime in your life or have a deep or dirty wound.   HPV. You need this vaccine if you are a woman age 26 or younger. The vaccine is given in 3 doses over 6 months.   Measles, mumps, rubella (MMR). You need at least 1 dose of MMR if you were born in 1957 or later. You may also need a second dose.   Meningococcal. If you are age 19 to 21 and a first-year college student living in a residence hall, or have one of several medical conditions, you need to get vaccinated against meningococcal disease. You may also need additional booster doses.   Zoster (shingles). If you are age 60 or older, you should get this vaccine.   Varicella (chickenpox). If you have never had chickenpox or you were vaccinated but received only 1 dose, talk to your caregiver to find out if you need this vaccine.   Hepatitis A. You need this vaccine if you have a specific risk factor for hepatitis A virus infection or you simply wish to be protected from this disease. The vaccine is usually given as 2 doses, 6 to 18 months apart.   Hepatitis B. You need this vaccine if you have a specific risk factor for hepatitis B virus infection or you simply wish to be protected from this disease. The vaccine is given in 3 doses, usually over 6 months.  Preventive Services /  Frequency Ages 19 to 39  Blood pressure check.** / Every 1 to 2 years.   Lipid and cholesterol check.** / Every 5 years beginning at age 20.   Clinical breast exam.** / Every 3 years for women in their 20s and 30s.   Pap test.** / Every 2 years from ages 21 through 29. Every 3 years starting at age 30 through age 65 or 70 with a history of 3 consecutive normal Pap tests.   HPV screening.** / Every 3 years from ages 30 through ages 65 to 70 with a history of 3 consecutive normal Pap tests.   Hepatitis C blood test.** / For any individual with known risks for hepatitis C.   Skin self-exam. / Monthly.   Influenza immunization.** / Every year.   Pneumococcal polysaccharide immunization.** / 1 to 2 doses if you smoke cigarettes or if you have certain chronic medical conditions.   Tetanus, diphtheria, pertussis (Tdap, Td) immunization. / A one-time dose of Tdap vaccine. After that, you need a Td booster dose every 10 years.   HPV immunization. / 3 doses over 6 months, if you are 26 and younger.   Measles, mumps, rubella (MMR) immunization. / You need at least 1 dose of MMR if you were born in 1957 or later. You may also need a second dose.   Meningococcal immunization. / 1 dose if you are age 19 to 21 and a first-year college student living in a residence hall, or have one of several medical conditions, you need to get vaccinated against meningococcal disease. You may also need additional booster doses.   Varicella immunization.** / Consult your caregiver.   Hepatitis A immunization.** / Consult your caregiver. 2 doses, 6 to 18 months   apart.   Hepatitis B immunization.** / Consult your caregiver. 3 doses usually over 6 months.  Ages 40 to 64  Blood pressure check.** / Every 1 to 2 years.   Lipid and cholesterol check.** / Every 5 years beginning at age 20.   Clinical breast exam.** / Every year after age 40.   Mammogram.** / Every year beginning at age 40 and continuing for as  long as you are in good health. Consult with your caregiver.   Pap test.** / Every 3 years starting at age 30 through age 65 or 70 with a history of 3 consecutive normal Pap tests.   HPV screening.** / Every 3 years from ages 30 through ages 65 to 70 with a history of 3 consecutive normal Pap tests.   Fecal occult blood test (FOBT) of stool. / Every year beginning at age 50 and continuing until age 75. You may not need to do this test if you get a colonoscopy every 10 years.   Flexible sigmoidoscopy or colonoscopy.** / Every 5 years for a flexible sigmoidoscopy or every 10 years for a colonoscopy beginning at age 50 and continuing until age 75.   Hepatitis C blood test.** / For all people born from 1945 through 1965 and any individual with known risks for hepatitis C.   Skin self-exam. / Monthly.   Influenza immunization.** / Every year.   Pneumococcal polysaccharide immunization.** / 1 to 2 doses if you smoke cigarettes or if you have certain chronic medical conditions.   Tetanus, diphtheria, pertussis (Tdap, Td) immunization.** / A one-time dose of Tdap vaccine. After that, you need a Td booster dose every 10 years.   Measles, mumps, rubella (MMR) immunization. / You need at least 1 dose of MMR if you were born in 1957 or later. You may also need a second dose.   Varicella immunization.** / Consult your caregiver.   Meningococcal immunization.** / Consult your caregiver.   Hepatitis A immunization.** / Consult your caregiver. 2 doses, 6 to 18 months apart.   Hepatitis B immunization.** / Consult your caregiver. 3 doses, usually over 6 months.  Ages 65 and over  Blood pressure check.** / Every 1 to 2 years.   Lipid and cholesterol check.** / Every 5 years beginning at age 20.   Clinical breast exam.** / Every year after age 40.   Mammogram.** / Every year beginning at age 40 and continuing for as long as you are in good health. Consult with your caregiver.   Pap test.** /  Every 3 years starting at age 30 through age 65 or 70 with a 3 consecutive normal Pap tests. Testing can be stopped between 65 and 70 with 3 consecutive normal Pap tests and no abnormal Pap or HPV tests in the past 10 years.   HPV screening.** / Every 3 years from ages 30 through ages 65 or 70 with a history of 3 consecutive normal Pap tests. Testing can be stopped between 65 and 70 with 3 consecutive normal Pap tests and no abnormal Pap or HPV tests in the past 10 years.   Fecal occult blood test (FOBT) of stool. / Every year beginning at age 50 and continuing until age 75. You may not need to do this test if you get a colonoscopy every 10 years.   Flexible sigmoidoscopy or colonoscopy.** / Every 5 years for a flexible sigmoidoscopy or every 10 years for a colonoscopy beginning at age 50 and continuing until age 75.   Hepatitis   C blood test.** / For all people born from 1945 through 1965 and any individual with known risks for hepatitis C.   Osteoporosis screening.** / A one-time screening for women ages 65 and over and women at risk for fractures or osteoporosis.   Skin self-exam. / Monthly.   Influenza immunization.** / Every year.   Pneumococcal polysaccharide immunization.** / 1 dose at age 65 (or older) if you have never been vaccinated.   Tetanus, diphtheria, pertussis (Tdap, Td) immunization. / A one-time dose of Tdap vaccine if you are over 65 and have contact with an infant, are a healthcare worker, or simply want to be protected from whooping cough. After that, you need a Td booster dose every 10 years.   Varicella immunization.** / Consult your caregiver.   Meningococcal immunization.** / Consult your caregiver.   Hepatitis A immunization.** / Consult your caregiver. 2 doses, 6 to 18 months apart.   Hepatitis B immunization.** / Check with your caregiver. 3 doses, usually over 6 months.  ** Family history and personal history of risk and conditions may change your caregiver's  recommendations. Document Released: 04/28/2001 Document Revised: 02/19/2011 Document Reviewed: 07/28/2010 [employer] Patient Information 2012 [employer] 

## 2011-11-11 ENCOUNTER — Encounter: Payer: Self-pay | Admitting: Family Medicine

## 2012-01-12 IMAGING — US US TRANSVAGINAL NON-OB
1 series · 14 of 25 positions shown · non-contrast
Comparison: None

CLINICAL DATA: Menorrhagia.  PriorC-section x2.  History of
endometrial septation removed.  Gravida 2, para 2.  LMP 09/08/2009



[Series 1: us pelvis complete modify · 0.24mm/px · 56 acquisitions, 14 frames shown]
[im 1/56]
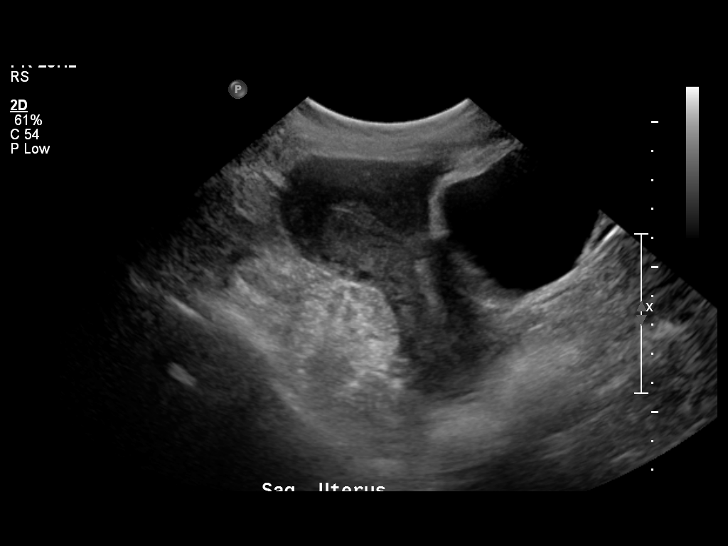
[im 5/56]
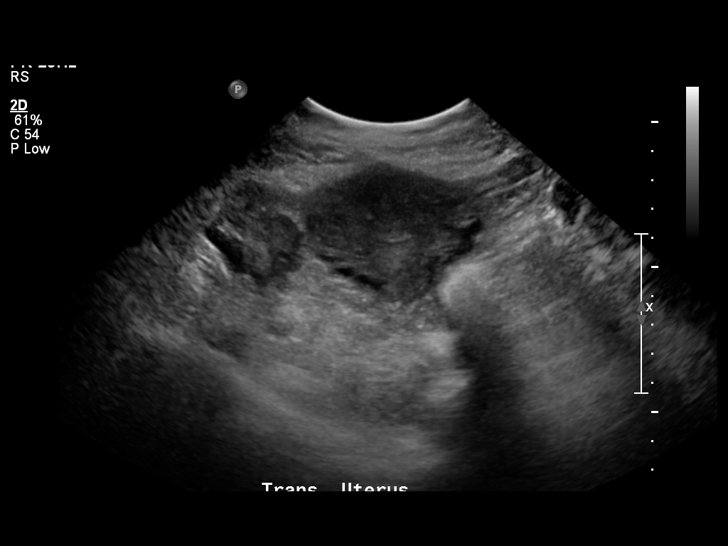
[im 10/56]
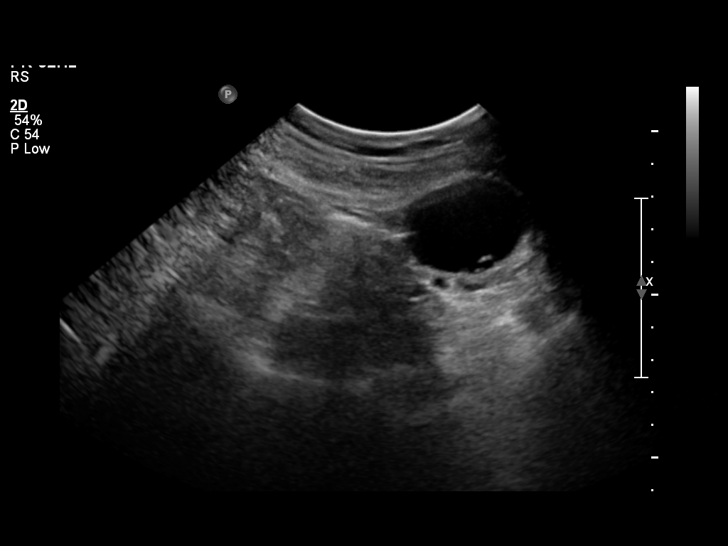
[im 14/56]
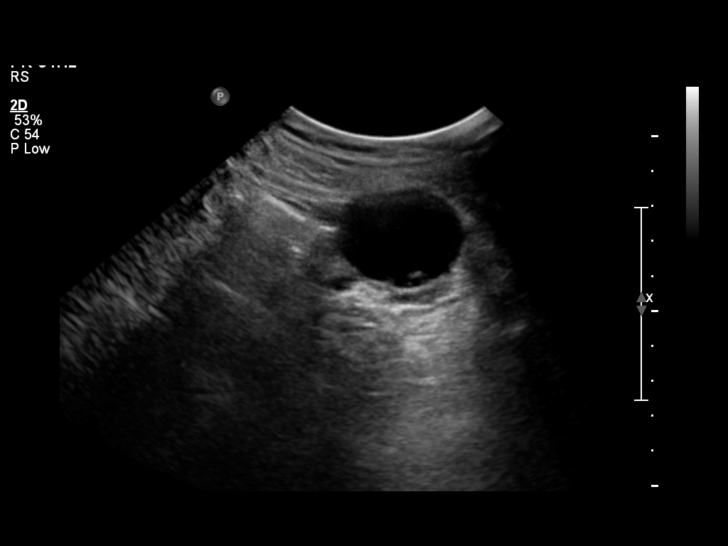
[im 19/56]
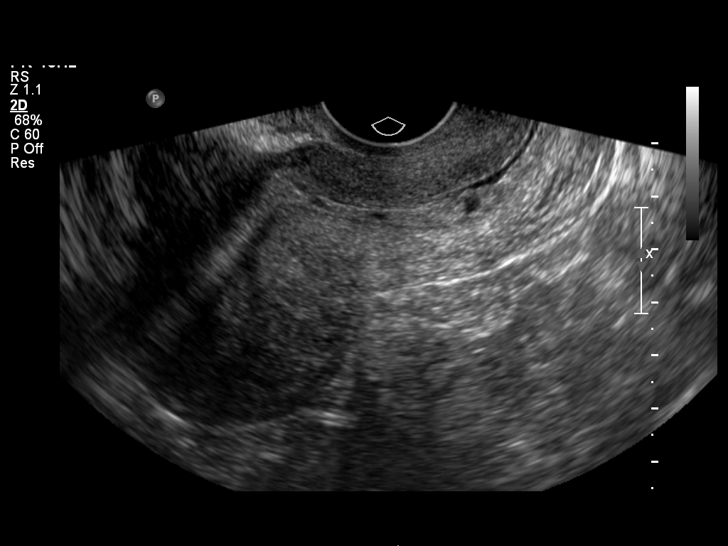
[im 21/56]
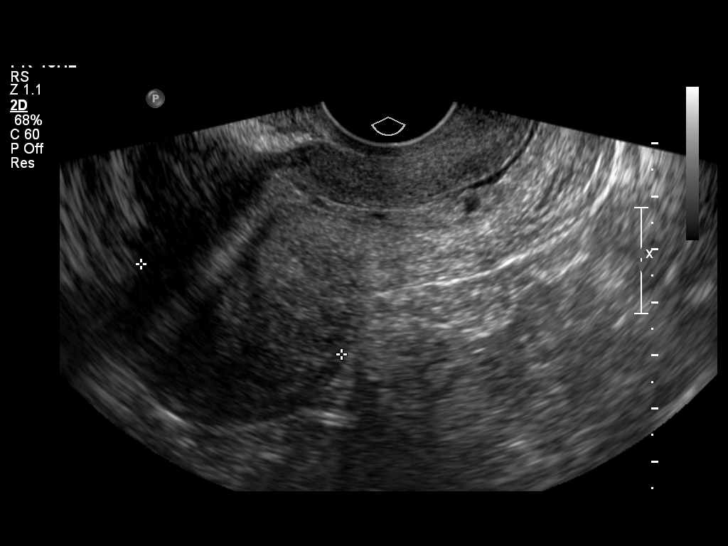
[im 26/56]
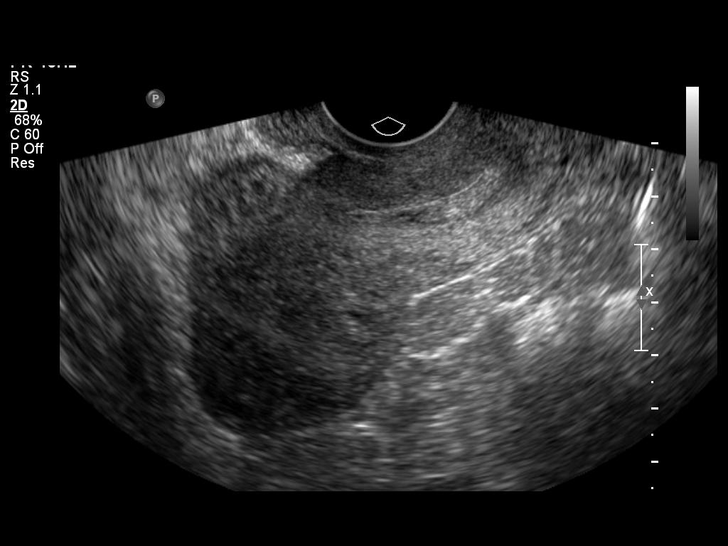
[im 30/56]
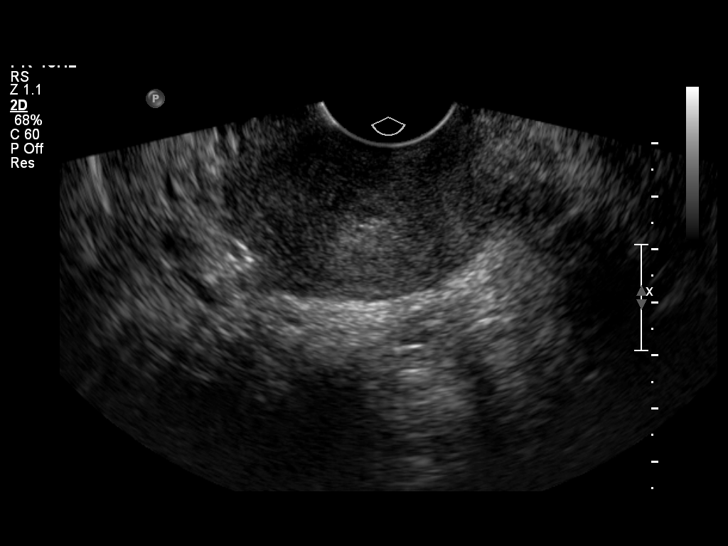
[im 35/56]
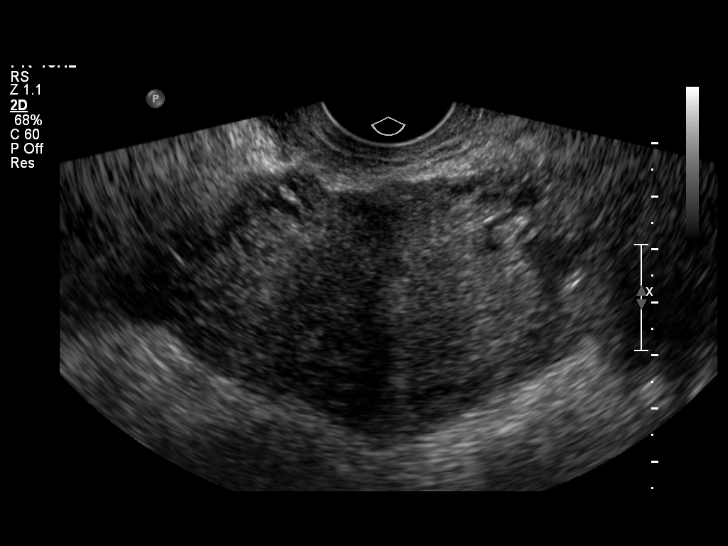
[im 37/56]
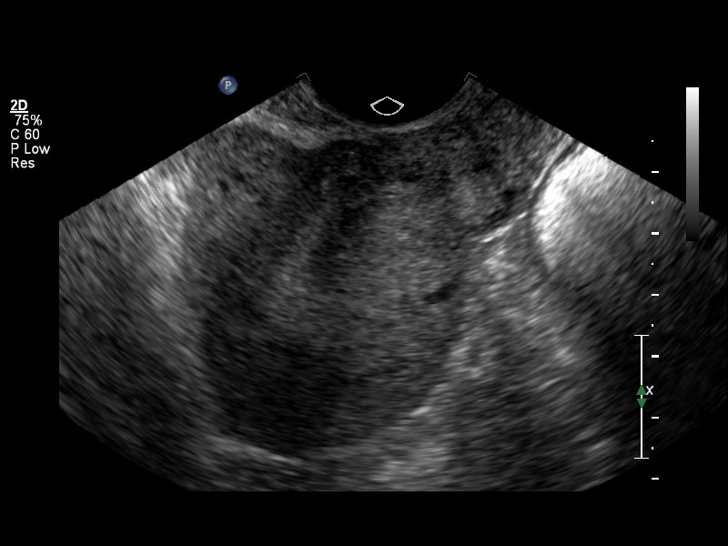
[im 42/56]
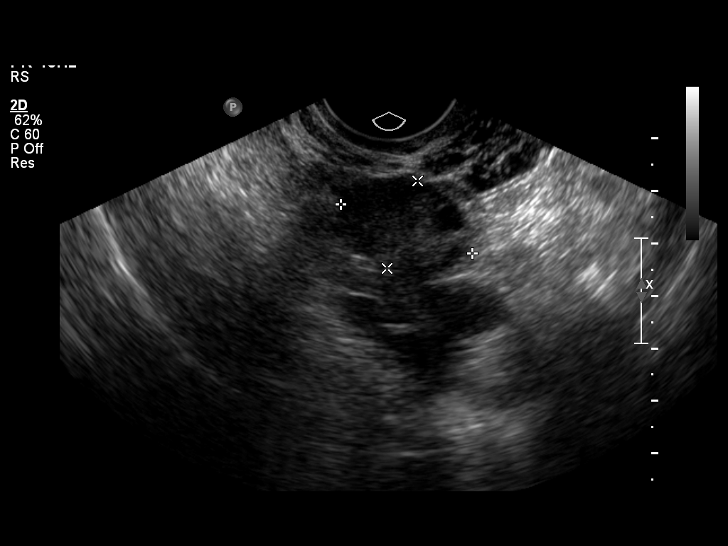
[im 46/56]
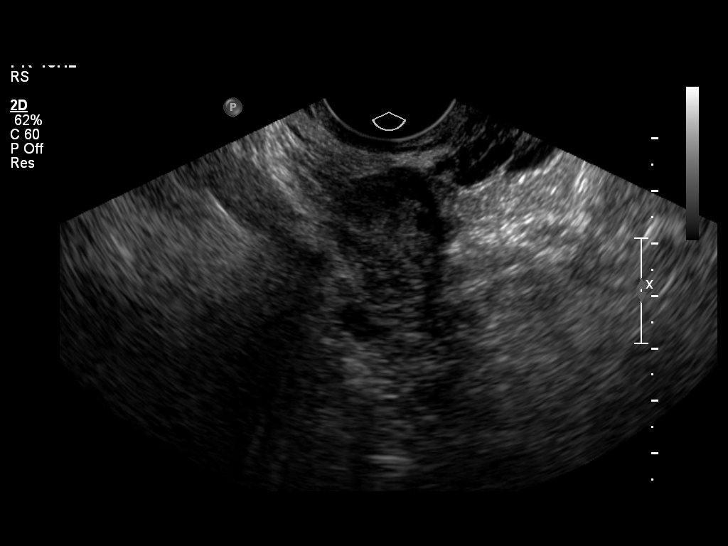
[im 51/56]
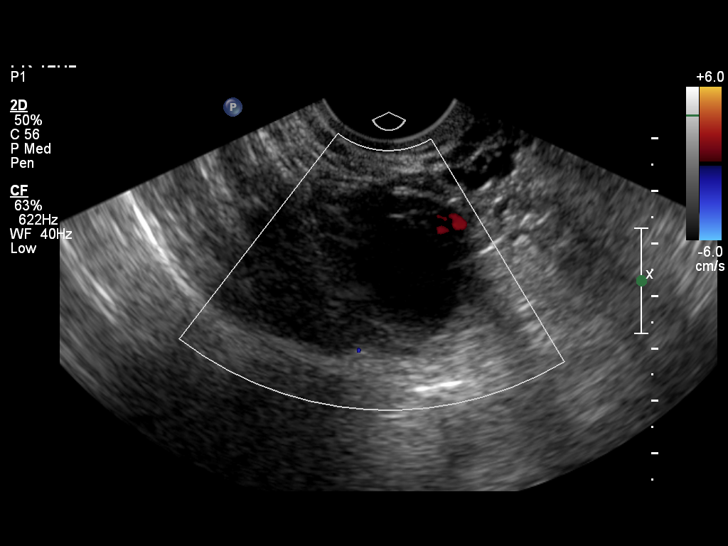
[im 56/56]
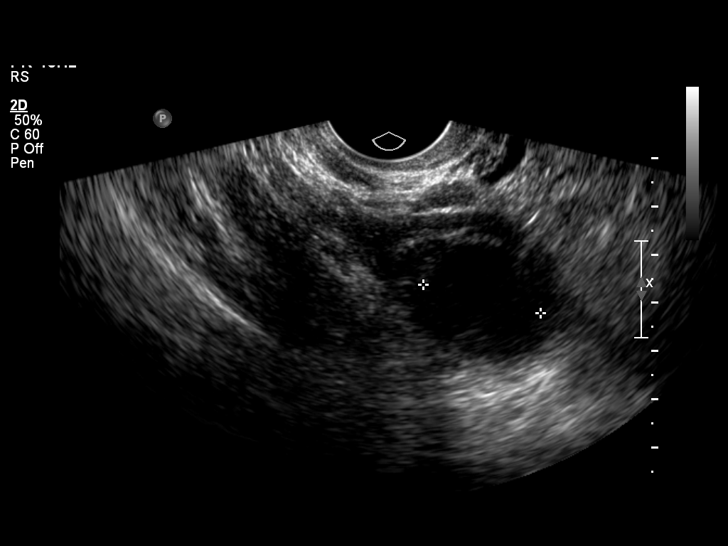

[14 of 25 positions shown; findings below may reference images not displayed]

FINDINGS: Uterus the uterus is 10.1 x 4.1 x 6.0 cm.

Endometrium the endometrium measures 6.6 mm.

Right Ovary the right ovary is 2.7 x 1.8 x 1.9 cm.

Left Ovary the left ovary is 3.9 x 2.8 x 3.8 cm.

Other Findings:  No free pelvic fluid.
IMPRESSION: Normal pelvic ultrasound.

## 2014-01-02 ENCOUNTER — Ambulatory Visit (INDEPENDENT_AMBULATORY_CARE_PROVIDER_SITE_OTHER): Payer: PRIVATE HEALTH INSURANCE | Admitting: Family Medicine

## 2014-01-02 ENCOUNTER — Encounter: Payer: Self-pay | Admitting: Family Medicine

## 2014-01-02 VITALS — BP 130/78 | HR 73 | Ht 66.0 in | Wt 131.2 lb

## 2014-01-02 DIAGNOSIS — Z124 Encounter for screening for malignant neoplasm of cervix: Secondary | ICD-10-CM

## 2014-01-02 DIAGNOSIS — Z23 Encounter for immunization: Secondary | ICD-10-CM

## 2014-01-02 DIAGNOSIS — Z01419 Encounter for gynecological examination (general) (routine) without abnormal findings: Secondary | ICD-10-CM

## 2014-01-02 DIAGNOSIS — Z1151 Encounter for screening for human papillomavirus (HPV): Secondary | ICD-10-CM

## 2014-01-02 DIAGNOSIS — Z800 Family history of malignant neoplasm of digestive organs: Secondary | ICD-10-CM

## 2014-01-02 LAB — LIPID PANEL
CHOL/HDL RATIO: 2.8 ratio
CHOLESTEROL: 143 mg/dL (ref 0–200)
HDL: 52 mg/dL (ref >39)
LDL Cholesterol: 75 mg/dL (ref 0–99)
Triglycerides: 81 mg/dL (ref <150)
VLDL: 16 mg/dL (ref 0–40)

## 2014-01-02 LAB — CBC
HEMATOCRIT: 40.4 % (ref 36.0–46.0)
HEMOGLOBIN: 14.2 g/dL (ref 12.0–15.0)
MCH: 30.6 pg (ref 26.0–34.0)
MCHC: 35.1 g/dL (ref 30.0–36.0)
MCV: 87.1 fL (ref 78.0–100.0)
PLATELETS: 203 10*3/uL (ref 150–400)
RBC: 4.64 MIL/uL (ref 3.87–5.11)
RDW: 12.8 % (ref 11.5–15.5)
WBC: 7.4 10*3/uL (ref 4.0–10.5)

## 2014-01-02 LAB — COMPREHENSIVE METABOLIC PANEL
ALT: 10 U/L (ref 0–35)
AST: 15 U/L (ref 0–37)
Albumin: 4.3 g/dL (ref 3.5–5.2)
Alkaline Phosphatase: 50 U/L (ref 39–117)
BUN: 9 mg/dL (ref 6–23)
CALCIUM: 9.2 mg/dL (ref 8.4–10.5)
CHLORIDE: 103 meq/L (ref 96–112)
CO2: 28 meq/L (ref 19–32)
CREATININE: 0.67 mg/dL (ref 0.50–1.10)
Glucose, Bld: 70 mg/dL (ref 70–99)
Potassium: 3.8 mEq/L (ref 3.5–5.3)
Sodium: 139 mEq/L (ref 135–145)
Total Bilirubin: 0.6 mg/dL (ref 0.2–1.2)
Total Protein: 6.7 g/dL (ref 6.0–8.3)

## 2014-01-02 LAB — TSH: TSH: 1.371 u[IU]/mL (ref 0.350–4.500)

## 2014-01-02 NOTE — Patient Instructions (Signed)
Preventive Care for Adults A healthy lifestyle and preventive care can promote health and wellness. Preventive health guidelines for women include the following key practices.  A routine yearly physical is a good way to check with your health care provider about your health and preventive screening. It is a chance to share any concerns and updates on your health and to receive a thorough exam.  Visit your dentist for a routine exam and preventive care every 6 months. Brush your teeth twice a day and floss once a day. Good oral hygiene prevents tooth decay and gum disease.  The frequency of eye exams is based on your age, health, family medical history, use of contact lenses, and other factors. Follow your health care provider's recommendations for frequency of eye exams.  Eat a healthy diet. Foods like vegetables, fruits, whole grains, low-fat dairy products, and lean protein foods contain the nutrients you need without too many calories. Decrease your intake of foods high in solid fats, added sugars, and salt. Eat the right amount of calories for you.Get information about a proper diet from your health care provider, if necessary.  Regular physical exercise is one of the most important things you can do for your health. Most adults should get at least 150 minutes of moderate-intensity exercise (any activity that increases your heart rate and causes you to sweat) each week. In addition, most adults need muscle-strengthening exercises on 2 or more days a week.  Maintain a healthy weight. The body mass index (BMI) is a screening tool to identify possible weight problems. It provides an estimate of body fat based on height and weight. Your health care provider can find your BMI and can help you achieve or maintain a healthy weight.For adults 20 years and older:  A BMI below 18.5 is considered underweight.  A BMI of 18.5 to 24.9 is normal.  A BMI of 25 to 29.9 is considered overweight.  A BMI of  30 and above is considered obese.  Maintain normal blood lipids and cholesterol levels by exercising and minimizing your intake of saturated fat. Eat a balanced diet with plenty of fruit and vegetables. Blood tests for lipids and cholesterol should begin at age 76 and be repeated every 5 years. If your lipid or cholesterol levels are high, you are over 50, or you are at high risk for heart disease, you may need your cholesterol levels checked more frequently.Ongoing high lipid and cholesterol levels should be treated with medicines if diet and exercise are not working.  If you smoke, find out from your health care provider how to quit. If you do not use tobacco, do not start.  Lung cancer screening is recommended for adults aged 22-80 years who are at high risk for developing lung cancer because of a history of smoking. A yearly low-dose CT scan of the lungs is recommended for people who have at least a 30-pack-year history of smoking and are a current smoker or have quit within the past 15 years. A pack year of smoking is smoking an average of 1 pack of cigarettes a day for 1 year (for example: 1 pack a day for 30 years or 2 packs a day for 15 years). Yearly screening should continue until the smoker has stopped smoking for at least 15 years. Yearly screening should be stopped for people who develop a health problem that would prevent them from having lung cancer treatment.  If you are pregnant, do not drink alcohol. If you are breastfeeding,  be very cautious about drinking alcohol. If you are not pregnant and choose to drink alcohol, do not have more than 1 drink per day. One drink is considered to be 12 ounces (355 mL) of beer, 5 ounces (148 mL) of wine, or 1.5 ounces (44 mL) of liquor.  Avoid use of street drugs. Do not share needles with anyone. Ask for help if you need support or instructions about stopping the use of drugs.  High blood pressure causes heart disease and increases the risk of  stroke. Your blood pressure should be checked at least every 1 to 2 years. Ongoing high blood pressure should be treated with medicines if weight loss and exercise do not work.  If you are 75-52 years old, ask your health care provider if you should take aspirin to prevent strokes.  Diabetes screening involves taking a blood sample to check your fasting blood sugar level. This should be done once every 3 years, after age 15, if you are within normal weight and without risk factors for diabetes. Testing should be considered at a younger age or be carried out more frequently if you are overweight and have at least 1 risk factor for diabetes.  Breast cancer screening is essential preventive care for women. You should practice "breast self-awareness." This means understanding the normal appearance and feel of your breasts and may include breast self-examination. Any changes detected, no matter how small, should be reported to a health care provider. Women in their 58s and 30s should have a clinical breast exam (CBE) by a health care provider as part of a regular health exam every 1 to 3 years. After age 16, women should have a CBE every year. Starting at age 53, women should consider having a mammogram (breast X-ray test) every year. Women who have a family history of breast cancer should talk to their health care provider about genetic screening. Women at a high risk of breast cancer should talk to their health care providers about having an MRI and a mammogram every year.  Breast cancer gene (BRCA)-related cancer risk assessment is recommended for women who have family members with BRCA-related cancers. BRCA-related cancers include breast, ovarian, tubal, and peritoneal cancers. Having family members with these cancers may be associated with an increased risk for harmful changes (mutations) in the breast cancer genes BRCA1 and BRCA2. Results of the assessment will determine the need for genetic counseling and  BRCA1 and BRCA2 testing.  Routine pelvic exams to screen for cancer are no longer recommended for nonpregnant women who are considered low risk for cancer of the pelvic organs (ovaries, uterus, and vagina) and who do not have symptoms. Ask your health care provider if a screening pelvic exam is right for you.  If you have had past treatment for cervical cancer or a condition that could lead to cancer, you need Pap tests and screening for cancer for at least 20 years after your treatment. If Pap tests have been discontinued, your risk factors (such as having a new sexual partner) need to be reassessed to determine if screening should be resumed. Some women have medical problems that increase the chance of getting cervical cancer. In these cases, your health care provider may recommend more frequent screening and Pap tests.  The HPV test is an additional test that may be used for cervical cancer screening. The HPV test looks for the virus that can cause the cell changes on the cervix. The cells collected during the Pap test can be  tested for HPV. The HPV test could be used to screen women aged 30 years and older, and should be used in women of any age who have unclear Pap test results. After the age of 30, women should have HPV testing at the same frequency as a Pap test.  Colorectal cancer can be detected and often prevented. Most routine colorectal cancer screening begins at the age of 50 years and continues through age 75 years. However, your health care provider may recommend screening at an earlier age if you have risk factors for colon cancer. On a yearly basis, your health care provider may provide home test kits to check for hidden blood in the stool. Use of a small camera at the end of a tube, to directly examine the colon (sigmoidoscopy or colonoscopy), can detect the earliest forms of colorectal cancer. Talk to your health care provider about this at age 50, when routine screening begins. Direct  exam of the colon should be repeated every 5-10 years through age 75 years, unless early forms of pre-cancerous polyps or small growths are found.  People who are at an increased risk for hepatitis B should be screened for this virus. You are considered at high risk for hepatitis B if:  You were born in a country where hepatitis B occurs often. Talk with your health care provider about which countries are considered high risk.  Your parents were born in a high-risk country and you have not received a shot to protect against hepatitis B (hepatitis B vaccine).  You have HIV or AIDS.  You use needles to inject street drugs.  You live with, or have sex with, someone who has hepatitis B.  You get hemodialysis treatment.  You take certain medicines for conditions like cancer, organ transplantation, and autoimmune conditions.  Hepatitis C blood testing is recommended for all people born from 1945 through 1965 and any individual with known risks for hepatitis C.  Practice safe sex. Use condoms and avoid high-risk sexual practices to reduce the spread of sexually transmitted infections (STIs). STIs include gonorrhea, chlamydia, syphilis, trichomonas, herpes, HPV, and human immunodeficiency virus (HIV). Herpes, HIV, and HPV are viral illnesses that have no cure. They can result in disability, cancer, and death.  You should be screened for sexually transmitted illnesses (STIs) including gonorrhea and chlamydia if:  You are sexually active and are younger than 24 years.  You are older than 24 years and your health care provider tells you that you are at risk for this type of infection.  Your sexual activity has changed since you were last screened and you are at an increased risk for chlamydia or gonorrhea. Ask your health care provider if you are at risk.  If you are at risk of being infected with HIV, it is recommended that you take a prescription medicine daily to prevent HIV infection. This is  called preexposure prophylaxis (PrEP). You are considered at risk if:  You are a heterosexual woman, are sexually active, and are at increased risk for HIV infection.  You take drugs by injection.  You are sexually active with a partner who has HIV.  Talk with your health care provider about whether you are at high risk of being infected with HIV. If you choose to begin PrEP, you should first be tested for HIV. You should then be tested every 3 months for as long as you are taking PrEP.  Osteoporosis is a disease in which the bones lose minerals and strength   with aging. This can result in serious bone fractures or breaks. The risk of osteoporosis can be identified using a bone density scan. Women ages 65 years and over and women at risk for fractures or osteoporosis should discuss screening with their health care providers. Ask your health care provider whether you should take a calcium supplement or vitamin D to reduce the rate of osteoporosis.  Menopause can be associated with physical symptoms and risks. Hormone replacement therapy is available to decrease symptoms and risks. You should talk to your health care provider about whether hormone replacement therapy is right for you.  Use sunscreen. Apply sunscreen liberally and repeatedly throughout the day. You should seek shade when your shadow is shorter than you. Protect yourself by wearing long sleeves, pants, a wide-brimmed hat, and sunglasses year round, whenever you are outdoors.  Once a month, do a whole body skin exam, using a mirror to look at the skin on your back. Tell your health care provider of new moles, moles that have irregular borders, moles that are larger than a pencil eraser, or moles that have changed in shape or color.  Stay current with required vaccines (immunizations).  Influenza vaccine. All adults should be immunized every year.  Tetanus, diphtheria, and acellular pertussis (Td, Tdap) vaccine. Pregnant women should  receive 1 dose of Tdap vaccine during each pregnancy. The dose should be obtained regardless of the length of time since the last dose. Immunization is preferred during the 27th-36th week of gestation. An adult who has not previously received Tdap or who does not know her vaccine status should receive 1 dose of Tdap. This initial dose should be followed by tetanus and diphtheria toxoids (Td) booster doses every 10 years. Adults with an unknown or incomplete history of completing a 3-dose immunization series with Td-containing vaccines should begin or complete a primary immunization series including a Tdap dose. Adults should receive a Td booster every 10 years.  Varicella vaccine. An adult without evidence of immunity to varicella should receive 2 doses or a second dose if she has previously received 1 dose. Pregnant females who do not have evidence of immunity should receive the first dose after pregnancy. This first dose should be obtained before leaving the health care facility. The second dose should be obtained 4-8 weeks after the first dose.  Human papillomavirus (HPV) vaccine. Females aged 13-26 years who have not received the vaccine previously should obtain the 3-dose series. The vaccine is not recommended for use in pregnant females. However, pregnancy testing is not needed before receiving a dose. If a female is found to be pregnant after receiving a dose, no treatment is needed. In that case, the remaining doses should be delayed until after the pregnancy. Immunization is recommended for any person with an immunocompromised condition through the age of 26 years if she did not get any or all doses earlier. During the 3-dose series, the second dose should be obtained 4-8 weeks after the first dose. The third dose should be obtained 24 weeks after the first dose and 16 weeks after the second dose.  Zoster vaccine. One dose is recommended for adults aged 60 years or older unless certain conditions are  present.  Measles, mumps, and rubella (MMR) vaccine. Adults born before 1957 generally are considered immune to measles and mumps. Adults born in 1957 or later should have 1 or more doses of MMR vaccine unless there is a contraindication to the vaccine or there is laboratory evidence of immunity to   each of the three diseases. A routine second dose of MMR vaccine should be obtained at least 28 days after the first dose for students attending postsecondary schools, health care workers, or international travelers. People who received inactivated measles vaccine or an unknown type of measles vaccine during 1963-1967 should receive 2 doses of MMR vaccine. People who received inactivated mumps vaccine or an unknown type of mumps vaccine before 1979 and are at high risk for mumps infection should consider immunization with 2 doses of MMR vaccine. For females of childbearing age, rubella immunity should be determined. If there is no evidence of immunity, females who are not pregnant should be vaccinated. If there is no evidence of immunity, females who are pregnant should delay immunization until after pregnancy. Unvaccinated health care workers born before 1957 who lack laboratory evidence of measles, mumps, or rubella immunity or laboratory confirmation of disease should consider measles and mumps immunization with 2 doses of MMR vaccine or rubella immunization with 1 dose of MMR vaccine.  Pneumococcal 13-valent conjugate (PCV13) vaccine. When indicated, a person who is uncertain of her immunization history and has no record of immunization should receive the PCV13 vaccine. An adult aged 19 years or older who has certain medical conditions and has not been previously immunized should receive 1 dose of PCV13 vaccine. This PCV13 should be followed with a dose of pneumococcal polysaccharide (PPSV23) vaccine. The PPSV23 vaccine dose should be obtained at least 8 weeks after the dose of PCV13 vaccine. An adult aged 19  years or older who has certain medical conditions and previously received 1 or more doses of PPSV23 vaccine should receive 1 dose of PCV13. The PCV13 vaccine dose should be obtained 1 or more years after the last PPSV23 vaccine dose.  Pneumococcal polysaccharide (PPSV23) vaccine. When PCV13 is also indicated, PCV13 should be obtained first. All adults aged 65 years and older should be immunized. An adult younger than age 65 years who has certain medical conditions should be immunized. Any person who resides in a nursing home or long-term care facility should be immunized. An adult smoker should be immunized. People with an immunocompromised condition and certain other conditions should receive both PCV13 and PPSV23 vaccines. People with human immunodeficiency virus (HIV) infection should be immunized as soon as possible after diagnosis. Immunization during chemotherapy or radiation therapy should be avoided. Routine use of PPSV23 vaccine is not recommended for American Indians, [place] Natives, or people younger than 65 years unless there are medical conditions that require PPSV23 vaccine. When indicated, people who have unknown immunization and have no record of immunization should receive PPSV23 vaccine. One-time revaccination 5 years after the first dose of PPSV23 is recommended for people aged 19-64 years who have chronic kidney failure, nephrotic syndrome, asplenia, or immunocompromised conditions. People who received 1-2 doses of PPSV23 before age 65 years should receive another dose of PPSV23 vaccine at age 65 years or later if at least 5 years have passed since the previous dose. Doses of PPSV23 are not needed for people immunized with PPSV23 at or after age 65 years.  Meningococcal vaccine. Adults with asplenia or persistent complement component deficiencies should receive 2 doses of quadrivalent meningococcal conjugate (MenACWY-D) vaccine. The doses should be obtained at least 2 months apart.  Microbiologists working with certain meningococcal bacteria, military recruits, people at risk during an outbreak, and people who travel to or live in countries with a high rate of meningitis should be immunized. A first-year college student up through age   21 years who is living in a residence hall should receive a dose if she did not receive a dose on or after her 16th birthday. Adults who have certain high-risk conditions should receive one or more doses of vaccine.  Hepatitis A vaccine. Adults who wish to be protected from this disease, have certain high-risk conditions, work with hepatitis A-infected animals, work in hepatitis A research labs, or travel to or work in countries with a high rate of hepatitis A should be immunized. Adults who were previously unvaccinated and who anticipate close contact with an international adoptee during the first 60 days after arrival in the [place] from a country with a high rate of hepatitis A should be immunized.  Hepatitis B vaccine. Adults who wish to be protected from this disease, have certain high-risk conditions, may be exposed to blood or other infectious body fluids, are household contacts or sex partners of hepatitis B positive people, are clients or workers in certain care facilities, or travel to or work in countries with a high rate of hepatitis B should be immunized.  Haemophilus influenzae type b (Hib) vaccine. A previously unvaccinated person with asplenia or sickle cell disease or having a scheduled splenectomy should receive 1 dose of Hib vaccine. Regardless of previous immunization, a recipient of a hematopoietic stem cell transplant should receive a 3-dose series 6-12 months after her successful transplant. Hib vaccine is not recommended for adults with HIV infection. Preventive Services / Frequency Ages 64 to 68 years  Blood pressure check.** / Every 1 to 2 years.  Lipid and cholesterol check.** / Every 5 years beginning at age  22.  Clinical breast exam.** / Every 3 years for women in their 88s and 53s.  BRCA-related cancer risk assessment.** / For women who have family members with a BRCA-related cancer (breast, ovarian, tubal, or peritoneal cancers).  Pap test.** / Every 2 years from ages 90 through 51. Every 3 years starting at age 21 through age 56 or 3 with a history of 3 consecutive normal Pap tests.  HPV screening.** / Every 3 years from ages 24 through ages 1 to 46 with a history of 3 consecutive normal Pap tests.  Hepatitis C blood test.** / For any individual with known risks for hepatitis C.  Skin self-exam. / Monthly.  Influenza vaccine. / Every year.  Tetanus, diphtheria, and acellular pertussis (Tdap, Td) vaccine.** / Consult your health care provider. Pregnant women should receive 1 dose of Tdap vaccine during each pregnancy. 1 dose of Td every 10 years.  Varicella vaccine.** / Consult your health care provider. Pregnant females who do not have evidence of immunity should receive the first dose after pregnancy.  HPV vaccine. / 3 doses over 6 months, if 72 and younger. The vaccine is not recommended for use in pregnant females. However, pregnancy testing is not needed before receiving a dose.  Measles, mumps, rubella (MMR) vaccine.** / You need at least 1 dose of MMR if you were born in 1957 or later. You may also need a 2nd dose. For females of childbearing age, rubella immunity should be determined. If there is no evidence of immunity, females who are not pregnant should be vaccinated. If there is no evidence of immunity, females who are pregnant should delay immunization until after pregnancy.  Pneumococcal 13-valent conjugate (PCV13) vaccine.** / Consult your health care provider.  Pneumococcal polysaccharide (PPSV23) vaccine.** / 1 to 2 doses if you smoke cigarettes or if you have certain conditions.  Meningococcal vaccine.** /  1 dose if you are age 19 to 21 years and a first-year college  student living in a residence hall, or have one of several medical conditions, you need to get vaccinated against meningococcal disease. You may also need additional booster doses.  Hepatitis A vaccine.** / Consult your health care provider.  Hepatitis B vaccine.** / Consult your health care provider.  Haemophilus influenzae type b (Hib) vaccine.** / Consult your health care provider. Ages 40 to 64 years  Blood pressure check.** / Every 1 to 2 years.  Lipid and cholesterol check.** / Every 5 years beginning at age 20 years.  Lung cancer screening. / Every year if you are aged 55-80 years and have a 30-pack-year history of smoking and currently smoke or have quit within the past 15 years. Yearly screening is stopped once you have quit smoking for at least 15 years or develop a health problem that would prevent you from having lung cancer treatment.  Clinical breast exam.** / Every year after age 40 years.  BRCA-related cancer risk assessment.** / For women who have family members with a BRCA-related cancer (breast, ovarian, tubal, or peritoneal cancers).  Mammogram.** / Every year beginning at age 40 years and continuing for as long as you are in good health. Consult with your health care provider.  Pap test.** / Every 3 years starting at age 30 years through age 65 or 70 years with a history of 3 consecutive normal Pap tests.  HPV screening.** / Every 3 years from ages 30 years through ages 65 to 70 years with a history of 3 consecutive normal Pap tests.  Fecal occult blood test (FOBT) of stool. / Every year beginning at age 50 years and continuing until age 75 years. You may not need to do this test if you get a colonoscopy every 10 years.  Flexible sigmoidoscopy or colonoscopy.** / Every 5 years for a flexible sigmoidoscopy or every 10 years for a colonoscopy beginning at age 50 years and continuing until age 75 years.  Hepatitis C blood test.** / For all people born from 1945 through  1965 and any individual with known risks for hepatitis C.  Skin self-exam. / Monthly.  Influenza vaccine. / Every year.  Tetanus, diphtheria, and acellular pertussis (Tdap/Td) vaccine.** / Consult your health care provider. Pregnant women should receive 1 dose of Tdap vaccine during each pregnancy. 1 dose of Td every 10 years.  Varicella vaccine.** / Consult your health care provider. Pregnant females who do not have evidence of immunity should receive the first dose after pregnancy.  Zoster vaccine.** / 1 dose for adults aged 60 years or older.  Measles, mumps, rubella (MMR) vaccine.** / You need at least 1 dose of MMR if you were born in 1957 or later. You may also need a 2nd dose. For females of childbearing age, rubella immunity should be determined. If there is no evidence of immunity, females who are not pregnant should be vaccinated. If there is no evidence of immunity, females who are pregnant should delay immunization until after pregnancy.  Pneumococcal 13-valent conjugate (PCV13) vaccine.** / Consult your health care provider.  Pneumococcal polysaccharide (PPSV23) vaccine.** / 1 to 2 doses if you smoke cigarettes or if you have certain conditions.  Meningococcal vaccine.** / Consult your health care provider.  Hepatitis A vaccine.** / Consult your health care provider.  Hepatitis B vaccine.** / Consult your health care provider.  Haemophilus influenzae type b (Hib) vaccine.** / Consult your health care provider. Ages 65   years and over  Blood pressure check.** / Every 1 to 2 years.  Lipid and cholesterol check.** / Every 5 years beginning at age 22 years.  Lung cancer screening. / Every year if you are aged 73-80 years and have a 30-pack-year history of smoking and currently smoke or have quit within the past 15 years. Yearly screening is stopped once you have quit smoking for at least 15 years or develop a health problem that would prevent you from having lung cancer  treatment.  Clinical breast exam.** / Every year after age 4 years.  BRCA-related cancer risk assessment.** / For women who have family members with a BRCA-related cancer (breast, ovarian, tubal, or peritoneal cancers).  Mammogram.** / Every year beginning at age 40 years and continuing for as long as you are in good health. Consult with your health care provider.  Pap test.** / Every 3 years starting at age 9 years through age 34 or 91 years with 3 consecutive normal Pap tests. Testing can be stopped between 65 and 70 years with 3 consecutive normal Pap tests and no abnormal Pap or HPV tests in the past 10 years.  HPV screening.** / Every 3 years from ages 57 years through ages 64 or 45 years with a history of 3 consecutive normal Pap tests. Testing can be stopped between 65 and 70 years with 3 consecutive normal Pap tests and no abnormal Pap or HPV tests in the past 10 years.  Fecal occult blood test (FOBT) of stool. / Every year beginning at age 15 years and continuing until age 17 years. You may not need to do this test if you get a colonoscopy every 10 years.  Flexible sigmoidoscopy or colonoscopy.** / Every 5 years for a flexible sigmoidoscopy or every 10 years for a colonoscopy beginning at age 86 years and continuing until age 71 years.  Hepatitis C blood test.** / For all people born from [birth year] through 1964 and any individual with known risks for hepatitis C.  Osteoporosis screening.** / A one-time screening for women ages 83 years and over and women at risk for fractures or osteoporosis.  Skin self-exam. / Monthly.  Influenza vaccine. / Every year.  Tetanus, diphtheria, and acellular pertussis (Tdap/Td) vaccine.** / 1 dose of Td every 10 years.  Varicella vaccine.** / Consult your health care provider.  Zoster vaccine.** / 1 dose for adults aged 61 years or older.  Pneumococcal 13-valent conjugate (PCV13) vaccine.** / Consult your health care provider.  Pneumococcal  polysaccharide (PPSV23) vaccine.** / 1 dose for all adults aged 28 years and older.  Meningococcal vaccine.** / Consult your health care provider.  Hepatitis A vaccine.** / Consult your health care provider.  Hepatitis B vaccine.** / Consult your health care provider.  Haemophilus influenzae type b (Hib) vaccine.** / Consult your health care provider. ** Family history and personal history of risk and conditions may change your health care provider's recommendations. Document Released: 04/28/2001 Document Revised: 07/17/2013 Document Reviewed: 07/28/2010 [facility] Patient Information 2015 [place], [place]. This information is not intended to replace advice given to you by your health care provider. Make sure you discuss any questions you have with your health care provider.

## 2014-01-02 NOTE — Progress Notes (Signed)
  Subjective:     [name] is a 43 y.o. female and is here for a comprehensive physical exam. The patient reports no problems. Has sporadic menses which are very light.  History   Social History  . Marital Status: Married    Spouse Name: N/A    Number of Children: N/A  . Years of Education: N/A   Occupational History  . Not on file.   Social History Main Topics  . Smoking status: Never Smoker   . Smokeless tobacco: Never Used  . Alcohol Use: Yes     Comment: social  . Drug Use: No  . Sexual Activity: Yes    Partners: Male    Birth Control/ Protection: None     Comment: spouse had vasectomy.   Other Topics Concern  . Not on file   Social History Narrative  . No narrative on file   Health Maintenance  Topic Date Due  . Tetanus/tdap  09/02/1990  . Influenza Vaccine  10/14/2013  . Pap Smear  11/03/2014    The following portions of the patient's history were reviewed and updated as appropriate: allergies, current medications, past family history, past medical history, past social history, past surgical history and problem list.  Review of Systems A comprehensive review of systems was negative.   Objective:    BP 130/78  Pulse 73  Ht 5\' 6"  (1.676 m)  Wt 131 lb 3.2 oz (59.512 kg)  BMI 21.19 kg/m2 General appearance: alert, cooperative and appears stated age Head: Normocephalic, without obvious abnormality, atraumatic Neck: no adenopathy, supple, symmetrical, trachea midline and thyroid not enlarged, symmetric, no tenderness/mass/nodules Lungs: clear to auscultation bilaterally Breasts: normal appearance, no masses or tenderness Heart: regular rate and rhythm, S1, S2 normal, no murmur, click, rub or gallop Abdomen: soft, non-tender; bowel sounds normal; no masses,  no organomegaly Pelvic: cervix normal in appearance, external genitalia normal, no adnexal masses or tenderness, no cervical motion tenderness, uterus normal size, shape, and consistency and  vagina normal without discharge Extremities: extremities normal, atraumatic, no cyanosis or edema Pulses: 2+ and symmetric Skin: Skin color, texture, turgor normal. No rashes or lesions Lymph nodes: Cervical, supraclavicular, and axillary nodes normal. Neurologic: Grossly normal    Assessment:    Healthy female exam.      Plan:      Problem List Items Addressed This Visit     Unprioritized   Family history of colon cancer    Other Visit Diagnoses   Encounter for routine gynecological examination    -  Primary    Relevant Orders       CBC       Comprehensive metabolic panel       Lipid panel       TSH       Cytology - PAP       MM DIGITAL SCREENING BILATERAL    Need for immunization against influenza        Relevant Orders       Flu Vaccine QUAD 36+ mos IM (Completed)    Screening for malignant neoplasm of cervix        Relevant Orders       Cytology - PAP       See After Visit Summary for Counseling Recommendations

## 2014-01-03 LAB — CYTOLOGY - PAP

## 2014-01-15 ENCOUNTER — Encounter: Payer: Self-pay | Admitting: Family Medicine

## 2016-12-10 ENCOUNTER — Encounter: Payer: Self-pay | Admitting: *Deleted

## 2016-12-10 ENCOUNTER — Ambulatory Visit (INDEPENDENT_AMBULATORY_CARE_PROVIDER_SITE_OTHER): Payer: PRIVATE HEALTH INSURANCE | Admitting: Family Medicine

## 2016-12-10 ENCOUNTER — Encounter: Payer: Self-pay | Admitting: Family Medicine

## 2016-12-10 VITALS — BP 129/89 | HR 75 | Ht 66.0 in | Wt 133.0 lb

## 2016-12-10 DIAGNOSIS — Z23 Encounter for immunization: Secondary | ICD-10-CM

## 2016-12-10 DIAGNOSIS — Z0000 Encounter for general adult medical examination without abnormal findings: Secondary | ICD-10-CM

## 2016-12-10 DIAGNOSIS — Z800 Family history of malignant neoplasm of digestive organs: Secondary | ICD-10-CM

## 2016-12-10 DIAGNOSIS — Z1151 Encounter for screening for human papillomavirus (HPV): Secondary | ICD-10-CM | POA: Diagnosis not present

## 2016-12-10 DIAGNOSIS — Z124 Encounter for screening for malignant neoplasm of cervix: Secondary | ICD-10-CM

## 2016-12-10 DIAGNOSIS — Z01419 Encounter for gynecological examination (general) (routine) without abnormal findings: Secondary | ICD-10-CM

## 2016-12-10 NOTE — Patient Instructions (Signed)
Preventive Care 18-39 Years, Female Preventive care refers to lifestyle choices and visits with your health care provider that can promote health and wellness. What does preventive care include?  A yearly physical exam. This is also called an annual well check.  Dental exams once or twice a year.  Routine eye exams. Ask your health care provider how often you should have your eyes checked.  Personal lifestyle choices, including: ? Daily care of your teeth and gums. ? Regular physical activity. ? Eating a healthy diet. ? Avoiding tobacco and drug use. ? Limiting alcohol use. ? Practicing safe sex. ? Taking vitamin and mineral supplements as recommended by your health care provider. What happens during an annual well check? The services and screenings done by your health care provider during your annual well check will depend on your age, overall health, lifestyle risk factors, and family history of disease. Counseling Your health care provider may ask you questions about your:  Alcohol use.  Tobacco use.  Drug use.  Emotional well-being.  Home and relationship well-being.  Sexual activity.  Eating habits.  Work and work Statistician.  Method of birth control.  Menstrual cycle.  Pregnancy history.  Screening You may have the following tests or measurements:  Height, weight, and BMI.  Diabetes screening. This is done by checking your blood sugar (glucose) after you have not eaten for a while (fasting).  Blood pressure.  Lipid and cholesterol levels. These may be checked every 5 years starting at age 66.  Skin check.  Hepatitis C blood test.  Hepatitis B blood test.  Sexually transmitted disease (STD) testing.  BRCA-related cancer screening. This may be done if you have a family history of breast, ovarian, tubal, or peritoneal cancers.  Pelvic exam and Pap test. This may be done every 3 years starting at age 40. Starting at age 59, this may be done every 5  years if you have a Pap test in combination with an HPV test.  Discuss your test results, treatment options, and if necessary, the need for more tests with your health care provider. Vaccines Your health care provider may recommend certain vaccines, such as:  Influenza vaccine. This is recommended every year.  Tetanus, diphtheria, and acellular pertussis (Tdap, Td) vaccine. You may need a Td booster every 10 years.  Varicella vaccine. You may need this if you have not been vaccinated.  HPV vaccine. If you are 69 or younger, you may need three doses over 6 months.  Measles, mumps, and rubella (MMR) vaccine. You may need at least one dose of MMR. You may also need a second dose.  Pneumococcal 13-valent conjugate (PCV13) vaccine. You may need this if you have certain conditions and were not previously vaccinated.  Pneumococcal polysaccharide (PPSV23) vaccine. You may need one or two doses if you smoke cigarettes or if you have certain conditions.  Meningococcal vaccine. One dose is recommended if you are age 27-21 years and a first-year college student living in a residence hall, or if you have one of several medical conditions. You may also need additional booster doses.  Hepatitis A vaccine. You may need this if you have certain conditions or if you travel or work in places where you may be exposed to hepatitis A.  Hepatitis B vaccine. You may need this if you have certain conditions or if you travel or work in places where you may be exposed to hepatitis B.  Haemophilus influenzae type b (Hib) vaccine. You may need this if  you have certain risk factors.  Talk to your health care provider about which screenings and vaccines you need and how often you need them. This information is not intended to replace advice given to you by your health care provider. Make sure you discuss any questions you have with your health care provider. Document Released: 04/28/2001 Document Revised: 11/20/2015  Document Reviewed: 01/01/2015 Elsevier Interactive Patient Education  2017 [employer].

## 2016-12-10 NOTE — Progress Notes (Signed)
  Subjective:     [name] is a 46 y.o. female and is here for a comprehensive physical exam. The patient reports no problems. S/p endometrial ablation. Occasional light cycle. 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 Has kid off to college next year.  Social History   Social History  . Marital status: Married    Spouse name: N/A  . Number of children: N/A  . Years of education: N/A   Occupational History  . Not on file.   Social History Main Topics  . Smoking status: Never Smoker  . Smokeless tobacco: Never Used  . Alcohol use Yes     Comment: social  . Drug use: No  . Sexual activity: Yes    Partners: Male    Birth control/ protection: Surgical     Comment: spouse had vasectomy.   Other Topics Concern  . Not on file   Social History Narrative  . No narrative on file   Health Maintenance  Topic Date Due  . HIV Screening  09/02/1986  . TETANUS/TDAP  09/02/1990  . INFLUENZA VACCINE  10/14/2016  . MAMMOGRAM  11/03/2016  . PAP SMEAR  01/02/2017    The following portions of the patient's history were reviewed and updated as appropriate: allergies, current medications, past family history, past medical history, past social history, past surgical history and problem list.  Review of Systems Pertinent items noted in HPI and remainder of comprehensive ROS otherwise negative.   Objective:    BP 129/89   Pulse 75   Ht 5\' 6"  (1.676 m)   Wt 133 lb (60.3 kg)   BMI 21.47 kg/m  General appearance: alert, cooperative and appears stated age Head: Normocephalic, without obvious abnormality, atraumatic Neck: no adenopathy, supple, symmetrical, trachea midline and thyroid not enlarged, symmetric, no tenderness/mass/nodules Lungs: clear to auscultation bilaterally Breasts: normal appearance, no masses or tenderness Heart: regular rate and rhythm, S1, S2 normal, no murmur, click, rub or gallop Abdomen: soft, non-tender; bowel sounds normal; no masses,  no  organomegaly Pelvic: cervix normal in appearance, external genitalia normal, no adnexal masses or tenderness, no cervical motion tenderness, uterus normal size, shape, and consistency and vagina normal without discharge Extremities: extremities normal, atraumatic, no cyanosis or edema Pulses: 2+ and symmetric Skin: Skin color, texture, turgor normal. No rashes or lesions Lymph nodes: Cervical, supraclavicular, and axillary nodes normal. Neurologic: Grossly normal    Assessment:    Healthy female exam.      Plan:   Problem List Items Addressed This Visit      Unprioritized   Family history of colon cancer   Relevant Orders   Ambulatory referral to Gastroenterology    Other Visit Diagnoses    Well woman exam without gynecological exam    -  Primary   Relevant Orders   Flu Vaccine QUAD 36+ mos IM (Fluarix, Quad PF) (Completed)   Lipid panel (Completed)   CBC (Completed)   CMP and Liver (Completed)   TSH (Completed)   Screening for malignant neoplasm of cervix       Relevant Orders   Cytology - PAP     Return in 1 year (on 12/10/2017).     See After Visit Summary for Counseling Recommendations

## 2016-12-10 NOTE — Progress Notes (Signed)
Last pap 12/2013 Last MM 12/2015 - [facility]

## 2016-12-11 LAB — CMP AND LIVER
ALBUMIN: 4.6 g/dL (ref 3.5–5.5)
ALT: 18 IU/L (ref 0–32)
AST: 21 IU/L (ref 0–40)
Alkaline Phosphatase: 55 IU/L (ref 39–117)
BILIRUBIN TOTAL: 0.4 mg/dL (ref 0.0–1.2)
BILIRUBIN, DIRECT: 0.12 mg/dL (ref 0.00–0.40)
BUN: 12 mg/dL (ref 6–24)
CALCIUM: 9.3 mg/dL (ref 8.7–10.2)
CHLORIDE: 100 mmol/L (ref 96–106)
CO2: 24 mmol/L (ref 20–29)
Creatinine, Ser: 0.83 mg/dL (ref 0.57–1.00)
GFR calc non Af Amer: 85 mL/min/{1.73_m2} (ref >59)
GFR, EST AFRICAN AMERICAN: 98 mL/min/{1.73_m2} (ref >59)
Glucose: 83 mg/dL (ref 65–99)
Potassium: 4.8 mmol/L (ref 3.5–5.2)
SODIUM: 137 mmol/L (ref 134–144)
TOTAL PROTEIN: 6.9 g/dL (ref 6.0–8.5)

## 2016-12-11 LAB — CBC
HEMATOCRIT: 41.6 % (ref 34.0–46.6)
HEMOGLOBIN: 14.1 g/dL (ref 11.1–15.9)
MCH: 30.1 pg (ref 26.6–33.0)
MCHC: 33.9 g/dL (ref 31.5–35.7)
MCV: 89 fL (ref 79–97)
Platelets: 245 10*3/uL (ref 150–379)
RBC: 4.68 x10E6/uL (ref 3.77–5.28)
RDW: 12.6 % (ref 12.3–15.4)
WBC: 5.7 10*3/uL (ref 3.4–10.8)

## 2016-12-11 LAB — LIPID PANEL
CHOL/HDL RATIO: 2.7 ratio (ref 0.0–4.4)
Cholesterol, Total: 152 mg/dL (ref 100–199)
HDL: 56 mg/dL (ref >39)
LDL Calculated: 84 mg/dL (ref 0–99)
TRIGLYCERIDES: 59 mg/dL (ref 0–149)
VLDL Cholesterol Cal: 12 mg/dL (ref 5–40)

## 2016-12-11 LAB — TSH: TSH: 1.16 u[IU]/mL (ref 0.450–4.500)

## 2016-12-14 ENCOUNTER — Encounter: Payer: Self-pay | Admitting: Gastroenterology

## 2016-12-14 LAB — CYTOLOGY - PAP
DIAGNOSIS: NEGATIVE
HPV (WINDOPATH): NOT DETECTED

## 2016-12-30 ENCOUNTER — Encounter: Payer: Self-pay | Admitting: *Deleted

## 2017-02-02 ENCOUNTER — Encounter (INDEPENDENT_AMBULATORY_CARE_PROVIDER_SITE_OTHER): Payer: Self-pay

## 2017-02-02 ENCOUNTER — Ambulatory Visit (INDEPENDENT_AMBULATORY_CARE_PROVIDER_SITE_OTHER): Payer: PRIVATE HEALTH INSURANCE | Admitting: Gastroenterology

## 2017-02-02 ENCOUNTER — Encounter: Payer: Self-pay | Admitting: Gastroenterology

## 2017-02-02 VITALS — BP 148/86 | HR 80 | Ht 66.0 in | Wt 135.0 lb

## 2017-02-02 DIAGNOSIS — Z800 Family history of malignant neoplasm of digestive organs: Secondary | ICD-10-CM

## 2017-02-02 DIAGNOSIS — R1030 Lower abdominal pain, unspecified: Secondary | ICD-10-CM | POA: Diagnosis not present

## 2017-02-02 MED ORDER — SUPREP BOWEL PREP KIT 17.5-3.13-1.6 GM/177ML PO SOLN: ORAL | 0 refills | Status: DC

## 2017-02-02 NOTE — Progress Notes (Signed)
HPI :  46 y/o female with a history of menorrhagia and family history of colon cancer, referred her by Dr. Darron Doom for colon cancer screening and abdominal pain.  Father had colon cancer diagnosed at age 55. She's never had a prior colonoscopy. Her grandmother had gastric cancer diagnosed in her mid 33s. No other family history of cancer.  She reports she is generally in good health. She does endorse occasional lower mid abdominal pain which she states is sporadic. This will happen on average about once per month, usually occurs after eating a meal within 5-30 minutes, associated with the urge to have bowel movement. She subsequently has a loose stool and then her symptoms typically resolve. She states this is been ongoing for some time. At baseline she has one by one every 2 days or so which is normal form. She denies any blood in the stool. She denies much gas and bloating. She denies any weight loss. She denies any nausea or vomiting. She denies any problems eating. No dysphagia or odynophagia.    Past Medical History:  Diagnosis Date  . Menorrhagia      Past Surgical History:  Procedure Laterality Date  . ABLATION  2010  . CESAREAN SECTION     X2  . ENDOMETRIAL BIOPSY  2011   MENORRHAGIA   Family History  Problem Relation Age of Onset  . Cancer Father 67       COLON  . High blood pressure Father   . Alzheimer's disease Maternal Grandmother   . Cancer Paternal Grandmother        STOMACH  . Heart disease Paternal Grandfather        HEART ATTACK   Social History   Tobacco Use  . Smoking status: Never Smoker  . Smokeless tobacco: Never Used  Substance Use Topics  . Alcohol use: Yes    Comment: social  . Drug use: No   Current Outpatient Medications  Medication Sig Dispense Refill  . Multiple Vitamins-Minerals (MULTIVITAMIN WITH MINERALS) tablet Take 1 tablet by mouth daily.     No current facility-administered medications for this visit.    No Known  Allergies   Review of Systems: All systems reviewed and negative except where noted in HPI.   Lab Results  Component Value Date   WBC 5.7 12/10/2016   HGB 14.1 12/10/2016   HCT 41.6 12/10/2016   MCV 89 12/10/2016   PLT 245 12/10/2016    Lab Results  Component Value Date   CREATININE 0.83 12/10/2016   BUN 12 12/10/2016   NA 137 12/10/2016   K 4.8 12/10/2016   CL 100 12/10/2016   CO2 24 12/10/2016    Lab Results  Component Value Date   ALT 18 12/10/2016   AST 21 12/10/2016   ALKPHOS 55 12/10/2016   BILITOT 0.4 12/10/2016     Physical Exam: BP (!) 148/86   Pulse 80   Ht 5\' 6"  (1.676 m)   Wt 135 lb (61.2 kg)   BMI 21.79 kg/m  Constitutional: Pleasant,well-developed, female in no acute distress. HEENT: Normocephalic and atraumatic. Conjunctivae are normal. No scleral icterus. Neck supple.  Cardiovascular: Normal rate, regular rhythm.  Pulmonary/chest: Effort normal and breath sounds normal. No wheezing, rales or rhonchi. Abdominal: Soft, nondistended, nontender. There are no masses palpable. No hepatomegaly. Extremities: no edema Lymphadenopathy: No cervical adenopathy noted. Neurological: Alert and oriented to person place and time. Skin: Skin is warm and dry. No rashes noted. Psychiatric: Normal mood and  affect. Behavior is normal.   ASSESSMENT AND PLAN: 46 year old female with strong family history colon cancer here for first time screening and lower abdominal pain.  I discussed colon cancer screening with her. Given her father's history of colon cancer she is overdue for screening. Optical colonoscopy is recommended. I discussed risks and benefits of optical colonoscopy with her and she wanted to proceed. I advised her that her siblings should have screening colonoscopy if they are age 46 or older and if it has not been done yet  We discussed differential for abdominal discomfort. Sounds like she may be having bowel spasm/IBS given her relation to her bowel  movements. I discussed all for her a trial of Bentyl however she wishes to hold off for now, and wants to wait results for colonoscopy first.   All questions answered she agreed with the plan.   Rio Bravo Cellar, MD [facility] Pager [phone]  CC: Donnamae Jude, MD

## 2017-02-02 NOTE — Patient Instructions (Addendum)
If you are age 45 or older, your body mass index should be between 23-30. Your Body mass index is 21.79 kg/m. If this is out of the aforementioned range listed, please consider follow up with your Primary Care Provider.  If you are age 26 or younger, your body mass index should be between 19-25. Your Body mass index is 21.79 kg/m. If this is out of the aformentioned range listed, please consider follow up with your Primary Care Provider.   You have been scheduled for a colonoscopy. Please follow written instructions given to you at your visit today.  Please pick up your prep supplies at the pharmacy within the next 1-3 days. If you use inhalers (even only as needed), please bring them with you on the day of your procedure. Your physician has requested that you go to www.startemmi.com and enter the access code given to you at your visit today. This web site gives a general overview about your procedure. However, you should still follow specific instructions given to you by our office regarding your preparation for the procedure.   Thank you.

## 2017-02-10 ENCOUNTER — Encounter: Payer: Self-pay | Admitting: Gastroenterology

## 2017-02-15 ENCOUNTER — Encounter: Payer: Self-pay | Admitting: Gastroenterology

## 2017-02-15 ENCOUNTER — Ambulatory Visit (AMBULATORY_SURGERY_CENTER): Payer: PRIVATE HEALTH INSURANCE | Admitting: Gastroenterology

## 2017-02-15 VITALS — BP 124/87 | HR 81 | Temp 97.5°F | Resp 19 | Ht 66.0 in | Wt 135.0 lb

## 2017-02-15 DIAGNOSIS — D128 Benign neoplasm of rectum: Secondary | ICD-10-CM | POA: Diagnosis not present

## 2017-02-15 DIAGNOSIS — K621 Rectal polyp: Secondary | ICD-10-CM | POA: Diagnosis not present

## 2017-02-15 DIAGNOSIS — D123 Benign neoplasm of transverse colon: Secondary | ICD-10-CM | POA: Diagnosis not present

## 2017-02-15 DIAGNOSIS — D122 Benign neoplasm of ascending colon: Secondary | ICD-10-CM | POA: Diagnosis not present

## 2017-02-15 DIAGNOSIS — Z800 Family history of malignant neoplasm of digestive organs: Secondary | ICD-10-CM | POA: Diagnosis present

## 2017-02-15 DIAGNOSIS — Z1211 Encounter for screening for malignant neoplasm of colon: Secondary | ICD-10-CM

## 2017-02-15 DIAGNOSIS — D129 Benign neoplasm of anus and anal canal: Secondary | ICD-10-CM

## 2017-02-15 MED ORDER — SODIUM CHLORIDE 0.9 % IV SOLN: 500.0000 mL | INTRAVENOUS | Status: DC

## 2017-02-15 NOTE — Progress Notes (Signed)
Called to room to assist during endoscopic procedure.  Patient ID and intended procedure confirmed with present staff. Received instructions for my participation in the procedure from the performing physician.  

## 2017-02-15 NOTE — Patient Instructions (Signed)
Impression/Recommendations:  Polyp handout given to patient.  Resume previous diet. Continue present medications.  Repeat colonoscopy recommended.   Date to be determined after pathology results reviewed.  YOU HAD AN ENDOSCOPIC PROCEDURE TODAY AT THE [facility]:   Refer to the procedure report that was given to you for any specific questions about what was found during the examination.  If the procedure report does not answer your questions, please call your gastroenterologist to clarify.  If you requested that your care partner not be given the details of your procedure findings, then the procedure report has been included in a sealed envelope for you to review at your convenience later.  YOU SHOULD EXPECT: Some feelings of bloating in the abdomen. Passage of more gas than usual.  Walking can help get rid of the air that was put into your GI tract during the procedure and reduce the bloating. If you had a lower endoscopy (such as a colonoscopy or flexible sigmoidoscopy) you may notice spotting of blood in your stool or on the toilet paper. If you underwent a bowel prep for your procedure, you may not have a normal bowel movement for a few days.  Please Note:  You might notice some irritation and congestion in your nose or some drainage.  This is from the oxygen used during your procedure.  There is no need for concern and it should clear up in a day or so.  SYMPTOMS TO REPORT IMMEDIATELY:   Following lower endoscopy (colonoscopy or flexible sigmoidoscopy):  Excessive amounts of blood in the stool  Significant tenderness or worsening of abdominal pains  Swelling of the abdomen that is new, acute  Fever of 100F or higher For urgent or emergent issues, a gastroenterologist can be reached at any hour by calling [phone].   DIET:  We do recommend a small meal at first, but then you may proceed to your regular diet.  Drink plenty of fluids but you should avoid alcoholic  beverages for 24 hours.  ACTIVITY:  You should plan to take it easy for the rest of today and you should NOT DRIVE or use heavy machinery until tomorrow (because of the sedation medicines used during the test).    FOLLOW UP: Our staff will call the number listed on your records the next business day following your procedure to check on you and address any questions or concerns that you may have regarding the information given to you following your procedure. If we do not reach you, we will leave a message.  However, if you are feeling well and you are not experiencing any problems, there is no need to return our call.  We will assume that you have returned to your regular daily activities without incident.  If any biopsies were taken you will be contacted by phone or by letter within the next 1-3 weeks.  Please call us at [phone] if you have not heard about the biopsies in 3 weeks.    SIGNATURES/CONFIDENTIALITY: You and/or your care partner have signed paperwork which will be entered into your electronic medical record.  These signatures attest to the fact that that the information above on your After Visit Summary has been reviewed and is understood.  Full responsibility of the confidentiality of this discharge information lies with you and/or your care-partner. 

## 2017-02-15 NOTE — Progress Notes (Signed)
A/ox3 pleased with MAC, report to [employer]

## 2017-02-15 NOTE — Op Note (Signed)
[place] Patient Name: [name] Procedure Date: 02/15/2017 10:05 AM MRN: [id number] Endoscopist: Remo Lipps P.  MD, MD Age: 46 Referring MD:  Date of Birth: May 10, 1970 Gender: Female Account #: [id number] Procedure:                Colonoscopy Indications:              Family history of colon cancer in a first-degree                            relative (father age 26s, grandmother age 42s) Medicines:                Monitored Anesthesia Care Procedure:                Pre-Anesthesia Assessment:                           - Prior to the procedure, a History and Physical                            was performed, and patient medications and                            allergies were reviewed. The patient's tolerance of                            previous anesthesia was also reviewed. The risks                            and benefits of the procedure and the sedation                            options and risks were discussed with the patient.                            All questions were answered, and informed consent                            was obtained. Prior Anticoagulants: The patient has                            taken no previous anticoagulant or antiplatelet                            agents. ASA Grade Assessment: I - A normal, healthy                            patient. After reviewing the risks and benefits,                            the patient was deemed in satisfactory condition to                            undergo the procedure.  After obtaining informed consent, the colonoscope                            was passed under direct vision. Throughout the                            procedure, the patient's blood pressure, pulse, and                            oxygen saturations were monitored continuously. The                            Model PCF-[id number]) scope was introduced                            through the  anus and advanced to the the terminal                            ileum, with identification of the appendiceal                            orifice and IC valve. The colonoscopy was performed                            without difficulty. The patient tolerated the                            procedure well. The quality of the bowel                            preparation was good. The terminal ileum, ileocecal                            valve, appendiceal orifice, and rectum were                            photographed. Scope In: 10:14:57 AM Scope Out: 10:29:40 AM Scope Withdrawal Time: 0 hours 13 minutes 28 seconds  Total Procedure Duration: 0 hours 14 minutes 43 seconds  Findings:                 The perianal and digital rectal examinations were                            normal.                           A 3 mm polyp was found in the ascending colon. The                            polyp was sessile. The polyp was removed with a                            cold snare. Resection and retrieval were complete.  A 4 mm polyp was found in the transverse colon. The                            polyp was flat. The polyp was removed with a cold                            snare. Resection and retrieval were complete.                           A 4 mm polyp was found in the rectum. The polyp was                            sessile. The polyp was removed with a cold snare.                            Resection and retrieval were complete.                           The terminal ileum appeared normal.                           The exam was otherwise without abnormality. Complications:            No immediate complications. Estimated blood loss:                            Minimal. Estimated Blood Loss:     Estimated blood loss was minimal. Impression:               - One 3 mm polyp in the ascending colon, removed                            with a cold snare. Resected and retrieved.                            - One 4 mm polyp in the transverse colon, removed                            with a cold snare. Resected and retrieved.                           - One 4 mm polyp in the rectum, removed with a cold                            snare. Resected and retrieved.                           - The examined portion of the ileum was normal.                           - The examination was otherwise normal. Recommendation:           - Patient has a contact number available for  emergencies. The signs and symptoms of potential                            delayed complications were discussed with the                            patient. Return to normal activities tomorrow.                            Written discharge instructions were provided to the                            patient.                           - Resume previous diet.                           - Continue present medications.                           - Await pathology results.                           - Repeat colonoscopy is recommended for                            surveillance. The colonoscopy date will be                            determined after pathology results from today's                            exam become available for review. Remo Lipps P.  MD, MD 02/15/2017 10:34:36 AM This report has been signed electronically.

## 2017-02-16 ENCOUNTER — Telehealth: Payer: Self-pay | Admitting: *Deleted

## 2017-02-16 NOTE — Telephone Encounter (Signed)
  Follow up Call-  Call back number 02/15/2017  Post procedure Call Back phone  # #[phone] hm  Permission to leave phone message Yes  Some recent data might be hidden     Patient questions:  Do you have a fever, pain , or abdominal swelling? No. Pain Score  0 *  Have you tolerated food without any problems? Yes.    Have you been able to return to your normal activities? Yes.    Do you have any questions about your discharge instructions: Diet   No. Medications  No. Follow up visit  No.  Do you have questions or concerns about your Care? No.  Actions: * If pain score is 4 or above: No action needed, pain <4.

## 2017-02-20 ENCOUNTER — Encounter: Payer: Self-pay | Admitting: Gastroenterology

## 2017-06-22 ENCOUNTER — Telehealth: Payer: Self-pay | Admitting: Gastroenterology

## 2017-06-22 NOTE — Telephone Encounter (Signed)
Left message for patient to return my call.  Hi [name], [name] MRN# [id number]. patient says that her insurance is telling her that her colonoscopy was coded as a screening(preventative) colonoscopy. I'm looking at her colon report and she did have polyps. Apparently her insurance pays better for diagnostic. Will you look into how this was coded for me?  Thank You, Trenton Gammon Patient Care Coordinator [facility] [phone]    Yes polyps were found but she had no history of polyps so it has to be billed as a screening. There is a family history of polyps but nothing that would make it diagnostic for her. She should appeal to her insurance. Also, she is not 47 years old so a lot of insurances won't pay till patient reaches the age of 57. Sorry there is nothing we can do for this patient. We would be committing insurance fraud if we billed it any other way. Have a wonderful day.... Barbara P. Lewis [facility]

## 2017-06-24 NOTE — Telephone Encounter (Signed)
Pt returned call and I gave her this information.

## 2017-09-24 ENCOUNTER — Telehealth: Payer: Self-pay | Admitting: Gastroenterology

## 2017-09-24 MED ORDER — DICYCLOMINE HCL 10 MG PO CAPS: 10.0000 mg | ORAL_CAPSULE | Freq: Three times a day (TID) | ORAL | 3 refills | Status: DC | PRN

## 2017-09-24 NOTE — Telephone Encounter (Signed)
Pt wants to know if she could be prescribed bentyl. She stated that last time she saw Dr. Havery Moros he told her that he could prescribe it as a trial. Pt uses [facility] in [place].

## 2017-09-24 NOTE — Telephone Encounter (Signed)
See office note from 01-2017

## 2017-09-29 ENCOUNTER — Encounter: Payer: Self-pay | Admitting: Radiology

## 2019-01-03 ENCOUNTER — Other Ambulatory Visit: Payer: Self-pay

## 2019-01-03 ENCOUNTER — Ambulatory Visit (INDEPENDENT_AMBULATORY_CARE_PROVIDER_SITE_OTHER): Payer: PRIVATE HEALTH INSURANCE | Admitting: Family Medicine

## 2019-01-03 ENCOUNTER — Encounter: Payer: Self-pay | Admitting: Family Medicine

## 2019-01-03 VITALS — BP 151/52 | HR 80 | Ht 66.0 in | Wt 133.6 lb

## 2019-01-03 DIAGNOSIS — Z01419 Encounter for gynecological examination (general) (routine) without abnormal findings: Secondary | ICD-10-CM

## 2019-01-03 DIAGNOSIS — Z1151 Encounter for screening for human papillomavirus (HPV): Secondary | ICD-10-CM | POA: Diagnosis not present

## 2019-01-03 DIAGNOSIS — Z124 Encounter for screening for malignant neoplasm of cervix: Secondary | ICD-10-CM | POA: Diagnosis not present

## 2019-01-03 DIAGNOSIS — Z23 Encounter for immunization: Secondary | ICD-10-CM | POA: Diagnosis not present

## 2019-01-03 DIAGNOSIS — Z800 Family history of malignant neoplasm of digestive organs: Secondary | ICD-10-CM

## 2019-01-03 NOTE — Progress Notes (Signed)
Last pap was 2018 No STI testing Flu shot wanted Last mamo 3 years ago

## 2019-01-03 NOTE — Assessment & Plan Note (Signed)
S/p colonoscopy.

## 2019-01-03 NOTE — Progress Notes (Signed)
Subjective:     [name] is a 48 y.o. female and is here for a comprehensive physical exam. The patient reports no problems.  Social History   Socioeconomic History  . Marital status: Married    Spouse name: Not on file  . Number of children: Not on file  . Years of education: Not on file  . Highest education level: Not on file  Occupational History  . Not on file  Social Needs  . Financial resource strain: Not on file  . Food insecurity    Worry: Not on file    Inability: Not on file  . Transportation needs    Medical: Not on file    Non-medical: Not on file  Tobacco Use  . Smoking status: Never Smoker  . Smokeless tobacco: Never Used  Substance and Sexual Activity  . Alcohol use: Yes    Comment: social  . Drug use: No  . Sexual activity: Yes    Partners: Male    Birth control/protection: Surgical    Comment: spouse had vasectomy.  Lifestyle  . Physical activity    Days per week: Not on file    Minutes per session: Not on file  . Stress: Not on file  Relationships  . Social Herbalist on phone: Not on file    Gets together: Not on file    Attends religious service: Not on file    Active member of club or organization: Not on file    Attends meetings of clubs or organizations: Not on file    Relationship status: Not on file  . Intimate partner violence    Fear of current or ex partner: Not on file    Emotionally abused: Not on file    Physically abused: Not on file    Forced sexual activity: Not on file  Other Topics Concern  . Not on file  Social History Narrative  . Not on file   Health Maintenance  Topic Date Due  . HIV Screening  09/02/1986  . TETANUS/TDAP  09/02/1990  . MAMMOGRAM  11/03/2016  . INFLUENZA VACCINE  10/15/2018  . PAP SMEAR-Modifier  12/11/2019  . COLONOSCOPY  02/15/2022    The following portions of the patient's history were reviewed and updated as appropriate: allergies, current medications, past family  history, past medical history, past social history, past surgical history and problem list.  Review of Systems Pertinent items noted in HPI and remainder of comprehensive ROS otherwise negative.   Objective:    BP (!) 151/52   Pulse 80   Ht 5\' 6"  (1.676 m)   Wt 133 lb 9.6 oz (60.6 kg)   BMI 21.56 kg/m  General appearance: alert, cooperative and appears stated age Head: Normocephalic, without obvious abnormality, atraumatic Neck: no adenopathy, supple, symmetrical, trachea midline and thyroid not enlarged, symmetric, no tenderness/mass/nodules Lungs: clear to auscultation bilaterally Breasts: normal appearance, no masses or tenderness Heart: regular rate and rhythm, S1, S2 normal, no murmur, click, rub or gallop Abdomen: soft, non-tender; bowel sounds normal; no masses,  no organomegaly Pelvic: cervix normal in appearance, external genitalia normal, no adnexal masses or tenderness, no cervical motion tenderness, uterus normal size, shape, and consistency and vagina normal without discharge Extremities: extremities normal, atraumatic, no cyanosis or edema Pulses: 2+ and symmetric Skin: Skin color, texture, turgor normal. No rashes or lesions Lymph nodes: Cervical, supraclavicular, and axillary nodes normal. Neurologic: Grossly normal    Assessment:    Healthy female exam.  Plan:   Problem List Items Addressed This Visit      Unprioritized   Family history of colon cancer    S/p colonoscopy       Other Visit Diagnoses    Encounter for gynecological examination without abnormal finding    -  Primary   Relevant Orders   CBC   Comprehensive metabolic panel   Hemoglobin A1c   TSH   Lipid panel   VITAMIN D 25 Hydroxy (Vit-D Deficiency, Fractures)   MM DIGITAL SCREENING BILATERAL   Screening for cervical cancer       Relevant Orders   Cytology - PAP( [facility] [facility])     Return in 1 year (on 01/03/2020).    See After Visit Summary for Counseling Recommendations

## 2019-01-04 ENCOUNTER — Telehealth: Payer: Self-pay | Admitting: *Deleted

## 2019-01-04 ENCOUNTER — Telehealth: Payer: Self-pay

## 2019-01-04 LAB — COMPREHENSIVE METABOLIC PANEL
ALT: 10 IU/L (ref 0–32)
AST: 14 IU/L (ref 0–40)
Albumin/Globulin Ratio: 1.9 (ref 1.2–2.2)
Albumin: 4.5 g/dL (ref 3.8–4.8)
Alkaline Phosphatase: 66 IU/L (ref 39–117)
BUN/Creatinine Ratio: 12 (ref 9–23)
BUN: 10 mg/dL (ref 6–24)
Bilirubin Total: 0.4 mg/dL (ref 0.0–1.2)
CO2: 23 mmol/L (ref 20–29)
Calcium: 9.4 mg/dL (ref 8.7–10.2)
Chloride: 105 mmol/L (ref 96–106)
Creatinine, Ser: 0.86 mg/dL (ref 0.57–1.00)
GFR calc Af Amer: 93 mL/min/{1.73_m2} (ref >59)
GFR calc non Af Amer: 81 mL/min/{1.73_m2} (ref >59)
Globulin, Total: 2.4 g/dL (ref 1.5–4.5)
Glucose: 81 mg/dL (ref 65–99)
Potassium: 4.1 mmol/L (ref 3.5–5.2)
Sodium: 141 mmol/L (ref 134–144)
Total Protein: 6.9 g/dL (ref 6.0–8.5)

## 2019-01-04 LAB — LIPID PANEL
Chol/HDL Ratio: 3.4 ratio (ref 0.0–4.4)
Cholesterol, Total: 155 mg/dL (ref 100–199)
HDL: 45 mg/dL (ref >39)
LDL Chol Calc (NIH): 94 mg/dL (ref 0–99)
Triglycerides: 84 mg/dL (ref 0–149)
VLDL Cholesterol Cal: 16 mg/dL (ref 5–40)

## 2019-01-04 LAB — CBC
Hematocrit: 40.9 % (ref 34.0–46.6)
Hemoglobin: 14.4 g/dL (ref 11.1–15.9)
MCH: 30.3 pg (ref 26.6–33.0)
MCHC: 35.2 g/dL (ref 31.5–35.7)
MCV: 86 fL (ref 79–97)
Platelets: 245 10*3/uL (ref 150–450)
RBC: 4.75 x10E6/uL (ref 3.77–5.28)
RDW: 11.5 % — ABNORMAL LOW (ref 11.7–15.4)
WBC: 8 10*3/uL (ref 3.4–10.8)

## 2019-01-04 LAB — HEMOGLOBIN A1C
Est. average glucose Bld gHb Est-mCnc: 100 mg/dL
Hgb A1c MFr Bld: 5.1 % (ref 4.8–5.6)

## 2019-01-04 LAB — VITAMIN D 25 HYDROXY (VIT D DEFICIENCY, FRACTURES): Vit D, 25-Hydroxy: 35.9 ng/mL (ref 30.0–100.0)

## 2019-01-04 LAB — TSH: TSH: 0.969 u[IU]/mL (ref 0.450–4.500)

## 2019-01-04 NOTE — Telephone Encounter (Signed)
Patient have been informed of normal results.

## 2019-01-04 NOTE — Telephone Encounter (Signed)
-----   Message from Donnamae Jude, MD sent at 01/04/2019  1:43 PM EDT ----- Normal labs, inform pt

## 2019-01-05 LAB — CYTOLOGY - PAP
Adequacy: ABSENT
Comment: NEGATIVE
Diagnosis: NEGATIVE
High risk HPV: NEGATIVE

## 2019-01-20 ENCOUNTER — Encounter: Payer: Self-pay | Admitting: Radiology

## 2019-03-20 ENCOUNTER — Encounter: Payer: Self-pay | Admitting: Radiology

## 2019-11-24 ENCOUNTER — Encounter: Payer: Self-pay | Admitting: Radiology

## 2020-02-15 ENCOUNTER — Other Ambulatory Visit: Payer: Self-pay

## 2020-02-15 ENCOUNTER — Encounter: Payer: Self-pay | Admitting: Family Medicine

## 2020-02-15 ENCOUNTER — Ambulatory Visit (INDEPENDENT_AMBULATORY_CARE_PROVIDER_SITE_OTHER): Payer: PRIVATE HEALTH INSURANCE | Admitting: Family Medicine

## 2020-02-15 VITALS — BP 152/98 | HR 70 | Ht 66.0 in | Wt 148.2 lb

## 2020-02-15 DIAGNOSIS — Z01419 Encounter for gynecological examination (general) (routine) without abnormal findings: Secondary | ICD-10-CM

## 2020-02-15 DIAGNOSIS — Z124 Encounter for screening for malignant neoplasm of cervix: Secondary | ICD-10-CM | POA: Diagnosis not present

## 2020-02-15 DIAGNOSIS — R030 Elevated blood-pressure reading, without diagnosis of hypertension: Secondary | ICD-10-CM

## 2020-02-15 DIAGNOSIS — Z23 Encounter for immunization: Secondary | ICD-10-CM

## 2020-02-15 NOTE — Progress Notes (Signed)
Flu No pap

## 2020-02-15 NOTE — Assessment & Plan Note (Signed)
Check at home and consider referral to PCP for BP treatment. DASH diet

## 2020-02-15 NOTE — Progress Notes (Signed)
  Subjective:     [name] is a 49 y.o. female and is here for a comprehensive physical exam. The patient reports problems - few hot flashes. Staying home. Has some hot flashes, no night sweats. Occasional mood swings. Occasional irregular cycles.  The following portions of the patient's history were reviewed and updated as appropriate: allergies, current medications, past family history, past medical history, past social history, past surgical history and problem list.  Review of Systems Pertinent items noted in HPI and remainder of comprehensive ROS otherwise negative.   Objective:    BP (!) 152/98   Pulse 70   Ht 5\' 6"  (1.676 m)   Wt 148 lb 3.2 oz (67.2 kg)   BMI 23.92 kg/m  General appearance: alert, cooperative and appears stated age Head: Normocephalic, without obvious abnormality, atraumatic Neck: no adenopathy, supple, symmetrical, trachea midline and thyroid not enlarged, symmetric, no tenderness/mass/nodules Lungs: clear to auscultation bilaterally Breasts: normal appearance, no masses or tenderness Heart: regular rate and rhythm, S1, S2 normal, no murmur, click, rub or gallop Abdomen: soft, non-tender; bowel sounds normal; no masses,  no organomegaly Pelvic: cervix normal in appearance, external genitalia normal, no adnexal masses or tenderness, no cervical motion tenderness, uterus normal size, shape, and consistency and vagina normal without discharge Extremities: extremities normal, atraumatic, no cyanosis or edema Pulses: 2+ and symmetric Skin: Skin color, texture, turgor normal. No rashes or lesions Lymph nodes: Cervical, supraclavicular, and axillary nodes normal. Neurologic: Grossly normal    Assessment:    Healthy female exam.      Plan:      Problem List Items Addressed This Visit      Unprioritized   Elevated blood pressure reading    Check at home and consider referral to PCP for BP treatment. DASH diet       Other Visit Diagnoses     Encounter for gynecological examination without abnormal finding    -  Primary   Relevant Orders   MM 3D SCREEN BREAST BILATERAL   CBC   Hemoglobin A1c   Comprehensive metabolic panel   TSH   VITAMIN D 25 Hydroxy (Vit-D Deficiency, Fractures)   Lipid panel   Screening for malignant neoplasm of cervix       Influenza vaccination administered at current visit       Relevant Orders   Flu Vaccine QUAD 36+ mos IM (Fluarix, Quad PF) (Completed)   Need for Tdap vaccination       Relevant Orders   Tdap vaccine greater than or equal to 7yo IM (Completed)     Return in 1 year (on 02/14/2021).  See After Visit Summary for Counseling Recommendations

## 2020-02-15 NOTE — Patient Instructions (Signed)
DASH Eating Plan DASH stands for "Dietary Approaches to Stop Hypertension." The DASH eating plan is a healthy eating plan that has been shown to reduce high blood pressure (hypertension). It may also reduce your risk for type 2 diabetes, heart disease, and stroke. The DASH eating plan may also help with weight loss. What are tips for following this plan?  General guidelines  Avoid eating more than 2,300 mg (milligrams) of salt (sodium) a day. If you have hypertension, you may need to reduce your sodium intake to 1,500 mg a day.  Limit alcohol intake to no more than 1 drink a day for nonpregnant women and 2 drinks a day for men. One drink equals 12 oz of beer, 5 oz of wine, or 1 oz of hard liquor.  Work with your health care provider to maintain a healthy body weight or to lose weight. Ask what an ideal weight is for you.  Get at least 30 minutes of exercise that causes your heart to beat faster (aerobic exercise) most days of the week. Activities may include walking, swimming, or biking.  Work with your health care provider or diet and nutrition specialist (dietitian) to adjust your eating plan to your individual calorie needs. Reading food labels   Check food labels for the amount of sodium per serving. Choose foods with less than 5 percent of the Daily Value of sodium. Generally, foods with less than 300 mg of sodium per serving fit into this eating plan.  To find whole grains, look for the word "whole" as the first word in the ingredient list. Shopping  Buy products labeled as "low-sodium" or "no salt added."  Buy fresh foods. Avoid canned foods and premade or frozen meals. Cooking  Avoid adding salt when cooking. Use salt-free seasonings or herbs instead of table salt or sea salt. Check with your health care provider or pharmacist before using salt substitutes.  Do not fry foods. Cook foods using healthy methods such as baking, boiling, grilling, and broiling instead.  Cook with  heart-healthy oils, such as olive, canola, soybean, or sunflower oil. Meal planning  Eat a balanced diet that includes: ? 5 or more servings of fruits and vegetables each day. At each meal, try to fill half of your plate with fruits and vegetables. ? Up to 6-8 servings of whole grains each day. ? Less than 6 oz of lean meat, poultry, or fish each day. A 3-oz serving of meat is about the same size as a deck of cards. One egg equals 1 oz. ? 2 servings of low-fat dairy each day. ? A serving of nuts, seeds, or beans 5 times each week. ? Heart-healthy fats. Healthy fats called Omega-3 fatty acids are found in foods such as flaxseeds and coldwater fish, like sardines, salmon, and mackerel.  Limit how much you eat of the following: ? Canned or prepackaged foods. ? Food that is high in trans fat, such as fried foods. ? Food that is high in saturated fat, such as fatty meat. ? Sweets, desserts, sugary drinks, and other foods with added sugar. ? Full-fat dairy products.  Do not salt foods before eating.  Try to eat at least 2 vegetarian meals each week.  Eat more home-cooked food and less restaurant, buffet, and fast food.  When eating at a restaurant, ask that your food be prepared with less salt or no salt, if possible. What foods are recommended? The items listed may not be a complete list. Talk with your dietitian about   what dietary choices are best for you. Grains Whole-grain or whole-wheat bread. Whole-grain or whole-wheat pasta. Brown rice. Oatmeal. Quinoa. Bulgur. Whole-grain and low-sodium cereals. Pita bread. Low-fat, low-sodium crackers. Whole-wheat flour tortillas. Vegetables Fresh or frozen vegetables (raw, steamed, roasted, or grilled). Low-sodium or reduced-sodium tomato and vegetable juice. Low-sodium or reduced-sodium tomato sauce and tomato paste. Low-sodium or reduced-sodium canned vegetables. Fruits All fresh, dried, or frozen fruit. Canned fruit in natural juice (without  added sugar). Meat and other protein foods Skinless chicken or turkey. Ground chicken or turkey. Pork with fat trimmed off. Fish and seafood. Egg whites. Dried beans, peas, or lentils. Unsalted nuts, nut butters, and seeds. Unsalted canned beans. Lean cuts of beef with fat trimmed off. Low-sodium, lean deli meat. Dairy Low-fat (1%) or fat-free (skim) milk. Fat-free, low-fat, or reduced-fat cheeses. Nonfat, low-sodium ricotta or cottage cheese. Low-fat or nonfat yogurt. Low-fat, low-sodium cheese. Fats and oils Soft margarine without trans fats. Vegetable oil. Low-fat, reduced-fat, or light mayonnaise and salad dressings (reduced-sodium). Canola, safflower, olive, soybean, and sunflower oils. Avocado. Seasoning and other foods Herbs. Spices. Seasoning mixes without salt. Unsalted popcorn and pretzels. Fat-free sweets. What foods are not recommended? The items listed may not be a complete list. Talk with your dietitian about what dietary choices are best for you. Grains Baked goods made with fat, such as croissants, muffins, or some breads. Dry pasta or rice meal packs. Vegetables Creamed or fried vegetables. Vegetables in a cheese sauce. Regular canned vegetables (not low-sodium or reduced-sodium). Regular canned tomato sauce and paste (not low-sodium or reduced-sodium). Regular tomato and vegetable juice (not low-sodium or reduced-sodium). Pickles. Olives. Fruits Canned fruit in a light or heavy syrup. Fried fruit. Fruit in cream or butter sauce. Meat and other protein foods Fatty cuts of meat. Ribs. Fried meat. Bacon. Sausage. Bologna and other processed lunch meats. Salami. Fatback. Hotdogs. Bratwurst. Salted nuts and seeds. Canned beans with added salt. Canned or smoked fish. Whole eggs or egg yolks. Chicken or turkey with skin. Dairy Whole or 2% milk, cream, and half-and-half. Whole or full-fat cream cheese. Whole-fat or sweetened yogurt. Full-fat cheese. Nondairy creamers. Whipped toppings.  Processed cheese and cheese spreads. Fats and oils Butter. Stick margarine. Lard. Shortening. Ghee. Bacon fat. Tropical oils, such as coconut, palm kernel, or palm oil. Seasoning and other foods Salted popcorn and pretzels. Onion salt, garlic salt, seasoned salt, table salt, and sea salt. Worcestershire sauce. Tartar sauce. Barbecue sauce. Teriyaki sauce. Soy sauce, including reduced-sodium. Steak sauce. Canned and packaged gravies. Fish sauce. Oyster sauce. Cocktail sauce. Horseradish that you find on the shelf. Ketchup. Mustard. Meat flavorings and tenderizers. Bouillon cubes. Hot sauce and Tabasco sauce. Premade or packaged marinades. Premade or packaged taco seasonings. Relishes. Regular salad dressings. Where to find more information:  National Heart, Lung, and Blood Institute: www.nhlbi.nih.gov  American Heart Association: www.heart.org Summary  The DASH eating plan is a healthy eating plan that has been shown to reduce high blood pressure (hypertension). It may also reduce your risk for type 2 diabetes, heart disease, and stroke.  With the DASH eating plan, you should limit salt (sodium) intake to 2,300 mg a day. If you have hypertension, you may need to reduce your sodium intake to 1,500 mg a day.  When on the DASH eating plan, aim to eat more fresh fruits and vegetables, whole grains, lean proteins, low-fat dairy, and heart-healthy fats.  Work with your health care provider or diet and nutrition specialist (dietitian) to adjust your eating plan to your   individual calorie needs. This information is not intended to replace advice given to you by your health care provider. Make sure you discuss any questions you have with your health care provider. Document Revised: 02/12/2017 Document Reviewed: 02/24/2016 Elsevier Patient Education  2020 [employer]  

## 2020-02-16 LAB — VITAMIN D 25 HYDROXY (VIT D DEFICIENCY, FRACTURES): Vit D, 25-Hydroxy: 35.5 ng/mL (ref 30.0–100.0)

## 2020-02-16 LAB — COMPREHENSIVE METABOLIC PANEL
ALT: 11 IU/L (ref 0–32)
AST: 17 IU/L (ref 0–40)
Albumin/Globulin Ratio: 1.8 (ref 1.2–2.2)
Albumin: 4.9 g/dL — ABNORMAL HIGH (ref 3.8–4.8)
Alkaline Phosphatase: 67 IU/L (ref 44–121)
BUN/Creatinine Ratio: 11 (ref 9–23)
BUN: 9 mg/dL (ref 6–24)
Bilirubin Total: 0.3 mg/dL (ref 0.0–1.2)
CO2: 22 mmol/L (ref 20–29)
Calcium: 9.6 mg/dL (ref 8.7–10.2)
Chloride: 102 mmol/L (ref 96–106)
Creatinine, Ser: 0.8 mg/dL (ref 0.57–1.00)
GFR calc Af Amer: 101 mL/min/{1.73_m2} (ref >59)
GFR calc non Af Amer: 87 mL/min/{1.73_m2} (ref >59)
Globulin, Total: 2.8 g/dL (ref 1.5–4.5)
Glucose: 74 mg/dL (ref 65–99)
Potassium: 4 mmol/L (ref 3.5–5.2)
Sodium: 141 mmol/L (ref 134–144)
Total Protein: 7.7 g/dL (ref 6.0–8.5)

## 2020-02-16 LAB — LIPID PANEL
Chol/HDL Ratio: 3.9 ratio (ref 0.0–4.4)
Cholesterol, Total: 187 mg/dL (ref 100–199)
HDL: 48 mg/dL (ref >39)
LDL Chol Calc (NIH): 115 mg/dL — ABNORMAL HIGH (ref 0–99)
Triglycerides: 135 mg/dL (ref 0–149)
VLDL Cholesterol Cal: 24 mg/dL (ref 5–40)

## 2020-02-16 LAB — CBC
Hematocrit: 44.8 % (ref 34.0–46.6)
Hemoglobin: 15.1 g/dL (ref 11.1–15.9)
MCH: 29.6 pg (ref 26.6–33.0)
MCHC: 33.7 g/dL (ref 31.5–35.7)
MCV: 88 fL (ref 79–97)
Platelets: 255 10*3/uL (ref 150–450)
RBC: 5.1 x10E6/uL (ref 3.77–5.28)
RDW: 11.5 % — ABNORMAL LOW (ref 11.7–15.4)
WBC: 7.4 10*3/uL (ref 3.4–10.8)

## 2020-02-16 LAB — TSH: TSH: 1.49 u[IU]/mL (ref 0.450–4.500)

## 2020-02-16 LAB — HEMOGLOBIN A1C
Est. average glucose Bld gHb Est-mCnc: 97 mg/dL
Hgb A1c MFr Bld: 5 % (ref 4.8–5.6)

## 2020-07-05 ENCOUNTER — Encounter: Payer: Self-pay | Admitting: Radiology

## 2021-06-11 ENCOUNTER — Other Ambulatory Visit (HOSPITAL_COMMUNITY)
Admission: RE | Admit: 2021-06-11 | Discharge: 2021-06-11 | Disposition: A | Discharge status: Home / Self Care | Payer: 59 | Source: Ambulatory Visit | Attending: Family Medicine | Admitting: Family Medicine

## 2021-06-11 ENCOUNTER — Other Ambulatory Visit: Payer: Self-pay

## 2021-06-11 ENCOUNTER — Encounter: Payer: Self-pay | Admitting: Family Medicine

## 2021-06-11 ENCOUNTER — Ambulatory Visit (INDEPENDENT_AMBULATORY_CARE_PROVIDER_SITE_OTHER): Payer: 59 | Admitting: Family Medicine

## 2021-06-11 VITALS — BP 146/88 | HR 76 | Ht 66.0 in | Wt 144.4 lb

## 2021-06-11 DIAGNOSIS — Z01419 Encounter for gynecological examination (general) (routine) without abnormal findings: Secondary | ICD-10-CM | POA: Diagnosis not present

## 2021-06-11 DIAGNOSIS — R030 Elevated blood-pressure reading, without diagnosis of hypertension: Secondary | ICD-10-CM

## 2021-06-11 DIAGNOSIS — Z124 Encounter for screening for malignant neoplasm of cervix: Secondary | ICD-10-CM | POA: Insufficient documentation

## 2021-06-11 NOTE — Progress Notes (Signed)
Subjective:  ?  ? [name] is a 51 y.o. female and is here for a comprehensive physical exam. The patient reports no problems. ?Has some light cycles q 6-8 weeks. ? ? ?The following portions of the patient's history were reviewed and updated as appropriate: allergies, current medications, past family history, past medical history, past social history, past surgical history, and problem list. ? ?Review of Systems ?Pertinent items noted in HPI and remainder of comprehensive ROS otherwise negative.  ? ?Objective:  ? ? BP (!) 146/88   Pulse 76   Ht '5\' 6"'$  (1.676 m)   Wt 144 lb 6.4 oz (65.5 kg)   BMI 23.31 kg/m?  ?General appearance: alert, cooperative, and appears stated age ?Head: Normocephalic, without obvious abnormality, atraumatic ?Neck: no adenopathy, supple, symmetrical, trachea midline, and thyroid not enlarged, symmetric, no tenderness/mass/nodules ?Lungs: clear to auscultation bilaterally ?Breasts: normal appearance, no masses or tenderness ?Heart: regular rate and rhythm, S1, S2 normal, no murmur, click, rub or gallop ?Abdomen: soft, non-tender; bowel sounds normal; no masses,  no organomegaly ?Pelvic: cervix normal in appearance, external genitalia normal, no adnexal masses or tenderness, no cervical motion tenderness, uterus normal size, shape, and consistency, and vagina normal without discharge ?Extremities: extremities normal, atraumatic, no cyanosis or edema ?Pulses: 2+ and symmetric ?Skin: Skin color, texture, turgor normal. No rashes or lesions ?Lymph nodes: Cervical, supraclavicular, and axillary nodes normal. ?Neurologic: Grossly normal  ?  ?Assessment:  ? ? Healthy female exam.    ?  ?Plan:  ? ?  ?Problem List Items Addressed This Visit   ? ?  ? Unprioritized  ? Elevated blood pressure reading  ?  Will follow-up with PCP--has appointment in May 2023. ?  ?  ? ?Other Visit Diagnoses   ? ? Screening for malignant neoplasm of cervix    -  Primary  ? Relevant Orders  ? Cytology - PAP  ?  Encounter for gynecological examination without abnormal finding      ? Relevant Orders  ? MM 3D SCREEN BREAST BILATERAL  ? ?  ? ?Return in 1 year (on 06/12/2022). ? ?See After Visit Summary for Counseling Recommendations  ? ?

## 2021-06-11 NOTE — Assessment & Plan Note (Signed)
Will follow-up with PCP--has appointment in May 2023. ?

## 2021-06-13 LAB — CYTOLOGY - PAP
Adequacy: ABSENT
Comment: NEGATIVE
Diagnosis: NEGATIVE
High risk HPV: NEGATIVE
